# Patient Record
Sex: Female | Born: 1990 | Race: Asian | Hispanic: No | Marital: Married | Smoking: Never smoker
Health system: Southern US, Community
[De-identification: ages and names within clinical notes are randomized; demographics above are authoritative.]

---

## 2015-08-02 NOTE — L&D Delivery Note (Signed)
  Delivery Note At 6:24 AM a viable female was delivered via Vaginal, Spontaneous Delivery (Presentation: OA  ).  APGAR: 8, 8; weight 7 lb 1.8 oz (3225 g).   Placenta status: spont , intact .  Cord:  3 vessel  Anesthesia:   epidural Episiotomy: None Lacerations: None Est. Blood Loss (mL): 150  Mom to postpartum.  Baby to Couplet care / Skin to Skin.  Cam HaiSHAW, KIMBERLY 03/10/2016, 6:34 AM

## 2015-10-12 ENCOUNTER — Emergency Department (HOSPITAL_COMMUNITY)
Admission: EM | Admit: 2015-10-12 | Discharge: 2015-10-13 | Disposition: A | Discharge status: Home / Self Care | Payer: Medicaid Other | Attending: Emergency Medicine | Admitting: Emergency Medicine

## 2015-10-12 ENCOUNTER — Encounter (HOSPITAL_COMMUNITY): Payer: Self-pay | Admitting: Emergency Medicine

## 2015-10-12 ENCOUNTER — Emergency Department (HOSPITAL_COMMUNITY): Payer: Medicaid Other

## 2015-10-12 DIAGNOSIS — O99512 Diseases of the respiratory system complicating pregnancy, second trimester: Secondary | ICD-10-CM | POA: Diagnosis not present

## 2015-10-12 DIAGNOSIS — Z3A16 16 weeks gestation of pregnancy: Secondary | ICD-10-CM | POA: Diagnosis not present

## 2015-10-12 DIAGNOSIS — J069 Acute upper respiratory infection, unspecified: Secondary | ICD-10-CM | POA: Insufficient documentation

## 2015-10-12 LAB — POC URINE PREG, ED: PREG TEST UR: POSITIVE — AB

## 2015-10-12 NOTE — ED Notes (Signed)
Pt. reports runny nose , sore throat , productive cough and chest congestion onset last week , denies fever or chills. Pt. stated she is pregnant , LMP Nov 2016 .

## 2015-10-12 NOTE — ED Provider Notes (Signed)
CSN: [id number]     Arrival date & time 10/12/15  2218 History  By signing my name below, I, Shannon Davila, attest that this documentation has been prepared under the direction and in the presence of Dhanya Bogle, PA-C. Electronically Signed: Octavia Davila, ED Scribe. 10/12/2015. 11:09 PM.    Chief Complaint  Patient presents with  . Cough  . Sore Throat   The history is provided by the patient. No language interpreter was used.   HPI Comments: Shannon Davila is a 25 y.o. female who is 4 months pregnant presents to the Emergency Department complaining of cold-like symptoms onset one week ago. Pt has associated cough, sneezing, sore throat, rhinorrhea, headache, mild fever, and mild chills. She did not measure her temperature at home but endorses feeling warm. Her cough is productive of yellow mucus and she reports chest congestion. Denies chest pain or SOB. She endorses pain increased pain when swallowing. Denies difficulty swallowing or handling secretions. Pt did not receive her flu shot.She has been eating and drinking normally and states that she is well-hydrated. Denies dizziness, syncope, ear pain, eye pain, neck pain, abdominal pain, nausea, vomiting, vaginal discharge, dysuria or vaginal bleeding. Denies known sick contacts. She does not have prenatal care.   History reviewed. No pertinent past medical history. History reviewed. No pertinent past surgical history. No family history on file. Social History  Substance Use Topics  . Smoking status: Never Smoker   . Smokeless tobacco: None  . Alcohol Use: No   OB History    Gravida Para Term Preterm AB TAB SAB Ectopic Multiple Living   1              Review of Systems  Constitutional: Positive for fever and chills. Negative for appetite change.  HENT: Positive for congestion, rhinorrhea, sneezing and sore throat. Negative for ear pain.   Eyes: Negative for discharge and redness.  Respiratory: Positive for cough. Negative for  shortness of breath.   Cardiovascular: Negative for chest pain.  Gastrointestinal: Negative for nausea, vomiting and abdominal pain.  Musculoskeletal: Negative for myalgias, neck pain and neck stiffness.  Neurological: Positive for headaches. Negative for dizziness and syncope.  All other systems reviewed and are negative.     Allergies  Review of patient's allergies indicates no known allergies.  Home Medications   Prior to Admission medications   Not on File   Triage vitals: BP 109/66 mmHg  Pulse 86  Temp(Src) 98.2 F (36.8 C) (Oral)  Resp 14  Ht  (1.575 m)  Wt 108 lb (48.988 kg)  BMI 19.75 kg/m2  SpO2 100% Physical Exam  Constitutional: She appears well-developed and well-nourished. No distress.  Nontoxic appearing  HENT:  Head: Normocephalic and atraumatic.  Right Ear: Tympanic membrane and ear canal normal.  Left Ear: Tympanic membrane and ear canal normal.  Nose: Rhinorrhea present. Right sinus exhibits no maxillary sinus tenderness and no frontal sinus tenderness. Left sinus exhibits no maxillary sinus tenderness and no frontal sinus tenderness.  Mouth/Throat: Uvula is midline, oropharynx is clear and moist and mucous membranes are normal. No oropharyngeal exudate, posterior oropharyngeal edema or posterior oropharyngeal erythema.  Eyes: Conjunctivae are normal. Right eye exhibits no discharge. Left eye exhibits no discharge. No scleral icterus.  Neck: Normal range of motion. Neck supple.  Cardiovascular: Normal rate, regular rhythm and normal heart sounds.   Pulmonary/Chest: Effort normal and breath sounds normal. No respiratory distress.  Abdominal: Soft. There is no tenderness.  Musculoskeletal: Normal range of  motion.  Lymphadenopathy:    She has no cervical adenopathy.  Neurological: She is alert. Coordination normal.  Skin: Skin is warm and dry.  Psychiatric: She has a normal mood and affect. Her behavior is normal.  Nursing note and vitals  reviewed.   ED Course  Procedures  DIAGNOSTIC STUDIES: Oxygen Saturation is 100% on RA, normal by my interpretation.  COORDINATION OF CARE:  11:08 PM Discussed treatment plan which includes chest x-ray and swab throat with pt at bedside and pt agreed to plan.  Labs Review Labs Reviewed  POC URINE PREG, ED - Abnormal; Notable for the following:    Preg Test, Ur POSITIVE (*)    All other components within normal limits  RAPID STREP SCREEN (NOT [facility])  CULTURE, GROUP A STREP [facility])    Imaging Review Dg Chest 2 View  10/12/2015  CLINICAL DATA:  Productive cough. EXAM: CHEST  2 VIEW COMPARISON:  None. FINDINGS: Cardiomediastinal silhouette is normal in size and configuration. Lungs are clear. Lung volumes are normal. No evidence of pneumonia. No pleural effusion. No pneumothorax. Osseous and soft tissue structures about the chest are unremarkable. IMPRESSION: Lungs are clear and there is no evidence of acute cardiopulmonary abnormality. No evidence of pneumonia. Electronically Signed   By: Bary RichardStan  Maynard M.D.   On: 10/12/2015 23:46   I have personally reviewed and evaluated these images and lab results as part of my medical decision-making.   EKG Interpretation None      MDM   Final diagnoses:  URI (upper respiratory infection)   Patient presenting with rhinorrhea, sore throat, cough, HA, subjective fevers and chills x 1 week. Afebrile. Pt is nontoxic appearing. Rhinorrhea noted. No sinus tenderness. TMs pearly gray without erythema or effusion. Oropharynx without erythema, edema or exudate. Lungs CTAB. CXR negative for acute infiltrate. Rapid strep negative. Patients symptoms are consistent with URI, likely viral etiology. Discussed that antibiotics are not indicated for viral infections. Instructed to use Tylenol for fever and pain control. Advised not to use NSAIDs. Patient is to call women's clinic in the morning to schedule a prenatal appointment and get a list of pregnancy safe  over-the-counter cold and flu medications. Discussed importance of prenatal care and pt verbalizes understanding and is agreeable with plan. Pt is hemodynamically stable & in NAD prior to dc.  I personally performed the services described in this documentation, which was scribed in my presence. The recorded information has been reviewed and is accurate.    Alveta HeimlichStevi Albert Devaul, PA-C 10/13/15 0047  Alveta HeimlichStevi Leela Vanbrocklin, PA-C 10/13/15 0050  Margarita Grizzleanielle Ray, MD 10/15/15 1240

## 2015-10-12 NOTE — ED Notes (Signed)
Pt reports she is 4 months pregnant, due in August.

## 2015-10-13 ENCOUNTER — Encounter: Payer: Self-pay | Admitting: *Deleted

## 2015-10-13 LAB — RAPID STREP SCREEN (MED CTR MEBANE ONLY): STREPTOCOCCUS, GROUP A SCREEN (DIRECT): NEGATIVE

## 2015-10-13 NOTE — Discharge Instructions (Signed)
Use tylenol for pain and fever control. Call the women's clinic tomorrow morning to schedule a prenatal appointment. They can help you pick safe over-the-counter cold and flu medications for pregnancy.. Do not take motrin or ibuprofen. Return to the emergency department with any new, worsening or concerning symptoms.   Upper Respiratory Infection, Adult Most upper respiratory infections (URIs) are a viral infection of the air passages leading to the lungs. A URI affects the nose, throat, and upper air passages. The most common type of URI is nasopharyngitis and is typically referred to as "the common cold." URIs run their course and usually go away on their own. Most of the time, a URI does not require medical attention, but sometimes a bacterial infection in the upper airways can follow a viral infection. This is called a secondary infection. Sinus and middle ear infections are common types of secondary upper respiratory infections. Bacterial pneumonia can also complicate a URI. A URI can worsen asthma and chronic obstructive pulmonary disease (COPD). Sometimes, these complications can require emergency medical care and may be life threatening.  CAUSES Almost all URIs are caused by viruses. A virus is a type of germ and can spread from one person to another.  RISKS FACTORS You may be at risk for a URI if:   You smoke.   You have chronic heart or lung disease.  You have a weakened defense (immune) system.   You are very young or very old.   You have nasal allergies or asthma.  You work in crowded or poorly ventilated areas.  You work in health care facilities or schools. SIGNS AND SYMPTOMS  Symptoms typically develop 2-3 days after you come in contact with a cold virus. Most viral URIs last 7-10 days. However, viral URIs from the influenza virus (flu virus) can last 14-18 days and are typically more severe. Symptoms may include:   Runny or stuffy (congested) nose.   Sneezing.    Cough.   Sore throat.   Headache.   Fatigue.   Fever.   Loss of appetite.   Pain in your forehead, behind your eyes, and over your cheekbones (sinus pain).  Muscle aches.  DIAGNOSIS  Your health care provider may diagnose a URI by:  Physical exam.  Tests to check that your symptoms are not due to another condition such as:  Strep throat.  Sinusitis.  Pneumonia.  Asthma. TREATMENT  A URI goes away on its own with time. It cannot be cured with medicines, but medicines may be prescribed or recommended to relieve symptoms. Medicines may help:  Reduce your fever.  Reduce your cough.  Relieve nasal congestion. HOME CARE INSTRUCTIONS   Take medicines only as directed by your health care provider.   Gargle warm saltwater or take cough drops to comfort your throat as directed by your health care provider.  Use a warm mist humidifier or inhale steam from a shower to increase air moisture. This may make it easier to breathe.  Drink enough fluid to keep your urine clear or pale yellow.   Eat soups and other clear broths and maintain good nutrition.   Rest as needed.   Return to work when your temperature has returned to normal or as your health care provider advises. You may need to stay home longer to avoid infecting others. You can also use a face mask and careful hand washing to prevent spread of the virus.  Increase the usage of your inhaler if you have asthma.  Do not use any tobacco products, including cigarettes, chewing tobacco, or electronic cigarettes. If you need help quitting, ask your health care provider. PREVENTION  The best way to protect yourself from getting a cold is to practice good hygiene.   Avoid oral or hand contact with people with cold symptoms.   Wash your hands often if contact occurs.  There is no clear evidence that vitamin C, vitamin E, echinacea, or exercise reduces the chance of developing a cold. However, it is  always recommended to get plenty of rest, exercise, and practice good nutrition.  SEEK MEDICAL CARE IF:   You are getting worse rather than better.   Your symptoms are not controlled by medicine.   You have chills.  You have worsening shortness of breath.  You have brown or red mucus.  You have yellow or brown nasal discharge.  You have pain in your face, especially when you bend forward.  You have a fever.  You have swollen neck glands.  You have pain while swallowing.  You have white areas in the back of your throat. SEEK IMMEDIATE MEDICAL CARE IF:   You have severe or persistent:  Headache.  Ear pain.  Sinus pain.  Chest pain.  You have chronic lung disease and any of the following:  Wheezing.  Prolonged cough.  Coughing up blood.  A change in your usual mucus.  You have a stiff neck.  You have changes in your:  Vision.  Hearing.  Thinking.  Mood. MAKE SURE YOU:   Understand these instructions.  Will watch your condition.  Will get help right away if you are not doing well or get worse.   This information is not intended to replace advice given to you by your health care provider. Make sure you discuss any questions you have with your health care provider.   Document Released: 01/11/2001 Document Revised: 12/02/2014 Document Reviewed: 10/23/2013 Elsevier Interactive Patient Education [employer].

## 2015-10-15 LAB — CULTURE, GROUP A STREP (THRC)

## 2015-11-04 ENCOUNTER — Encounter: Payer: Self-pay | Admitting: Certified Nurse Midwife

## 2015-11-04 ENCOUNTER — Other Ambulatory Visit (HOSPITAL_COMMUNITY)
Admission: RE | Admit: 2015-11-04 | Discharge: 2015-11-04 | Disposition: A | Discharge status: Home / Self Care | Payer: Medicaid Other | Source: Ambulatory Visit | Attending: Certified Nurse Midwife | Admitting: Certified Nurse Midwife

## 2015-11-04 ENCOUNTER — Ambulatory Visit (INDEPENDENT_AMBULATORY_CARE_PROVIDER_SITE_OTHER): Payer: Medicaid Other | Admitting: Advanced Practice Midwife

## 2015-11-04 VITALS — BP 91/50 | HR 66 | Temp 98.0°F | Wt 110.7 lb

## 2015-11-04 DIAGNOSIS — Z01419 Encounter for gynecological examination (general) (routine) without abnormal findings: Secondary | ICD-10-CM | POA: Insufficient documentation

## 2015-11-04 DIAGNOSIS — Z1389 Encounter for screening for other disorder: Secondary | ICD-10-CM

## 2015-11-04 DIAGNOSIS — Z113 Encounter for screening for infections with a predominantly sexual mode of transmission: Secondary | ICD-10-CM | POA: Diagnosis present

## 2015-11-04 DIAGNOSIS — Z3402 Encounter for supervision of normal first pregnancy, second trimester: Secondary | ICD-10-CM

## 2015-11-04 DIAGNOSIS — Z603 Acculturation difficulty: Secondary | ICD-10-CM | POA: Insufficient documentation

## 2015-11-04 DIAGNOSIS — O0932 Supervision of pregnancy with insufficient antenatal care, second trimester: Secondary | ICD-10-CM | POA: Insufficient documentation

## 2015-11-04 LAB — POCT URINALYSIS DIP (DEVICE)
Bilirubin Urine: NEGATIVE
Glucose, UA: NEGATIVE mg/dL
Hgb urine dipstick: NEGATIVE
Ketones, ur: NEGATIVE mg/dL
Nitrite: NEGATIVE
Protein, ur: 30 mg/dL — AB
Specific Gravity, Urine: 1.025 (ref 1.005–1.030)
Urobilinogen, UA: 0.2 mg/dL (ref 0.0–1.0)
pH: 7 (ref 5.0–8.0)

## 2015-11-04 NOTE — Progress Notes (Signed)
New OB. Routines reviewed.  See SmartSet  Subjective:    Shannon Davila is a G1P0 1368w3d being seen today for her first obstetrical visit.  Her obstetrical history is significant for Late to care, language barrier. Patient does intend to breast feed. Pregnancy history fully reviewed.  Patient reports no complaints.  Filed Vitals:   11/04/15 0815  BP: 91/50  Pulse: 66  Temp: 98 F (36.7 C)  Weight: 110 lb 11.2 oz (50.213 kg)    HISTORY: OB History  Gravida Para Term Preterm AB SAB TAB Ectopic Multiple Living  1             # Outcome Date GA Lbr Len/2nd Weight Sex Delivery Anes PTL Lv  1 Current              History reviewed. No pertinent past medical history. History reviewed. No pertinent past surgical history. History reviewed. No pertinent family history.   Exam    Uterus:  Fundal Height: 21 cm  Pelvic Exam:    Perineum: No Hemorrhoids, Normal Perineum   Vulva: Bartholin's, Urethra, Skene's normal   Vagina:  normal mucosa, normal discharge   pH:    Cervix: nulliparous appearance and Very friable cervix   Adnexa: no mass, fullness, tenderness   Bony Pelvis: gynecoid  System: Breast:  normal appearance, no masses or tenderness   Skin: normal coloration and turgor, no rashes    Neurologic: oriented, grossly non-focal   Extremities: normal strength, tone, and muscle mass   HEENT neck supple with midline trachea   Mouth/Teeth mucous membranes moist, pharynx normal without lesions   Neck supple and no masses   Cardiovascular: regular rate and rhythm, no murmurs or gallops   Respiratory:  appears well, vitals normal, no respiratory distress, acyanotic, normal RR, ear and throat exam is normal, neck free of mass or lymphadenopathy, chest clear, no wheezing, crepitations, rhonchi, normal symmetric air entry   Abdomen: soft, non-tender; bowel sounds normal; no masses,  no organomegaly   Urinary: urethral meatus normal      Assessment:    Pregnancy: G1P0 Patient  Active Problem List   Diagnosis Date Noted  . Language barrier, cultural differences 11/04/2015  . Supervision of normal first pregnancy in second trimester 11/04/2015  . Late prenatal care affecting pregnancy in second trimester, antepartum 11/04/2015        Plan:     Initial labs drawn. Prenatal vitamins. Problem list reviewed and updated. Genetic Screening discussed Quad Screen: ordered.  Ultrasound discussed; fetal survey: ordered.  Follow up in 4 weeks. 50% of 30 min visit spent on counseling and coordination of care.   Patient refused video interpretor. States husband translates for her. As we talked, husband spoke [place]. Patient understood most of my English and answered appropriately.  However, he states they do not speak each others' language (she speaks [place]ian, he speaks [place]).  He translates to her [place] to [place]. Though we spoke the same words, she listened to him.   I feel that from now on, we should insist on using translator, esp for important information.      [facility] HospitalWILLIAMS,Shannon Qazi 11/04/2015

## 2015-11-04 NOTE — Patient Instructions (Signed)

## 2015-11-05 LAB — PRENATAL PROFILE (SOLSTAS)
Antibody Screen: NEGATIVE
Basophils Absolute: 0 cells/uL (ref 0–200)
Basophils Relative: 0 %
Eosinophils Absolute: 103 cells/uL (ref 15–500)
Eosinophils Relative: 1 %
HCT: 31.4 % — ABNORMAL LOW (ref 35.0–45.0)
HIV 1&2 Ab, 4th Generation: NONREACTIVE
Hemoglobin: 10.5 g/dL — ABNORMAL LOW (ref 11.7–15.5)
Hepatitis B Surface Ag: NEGATIVE
Lymphocytes Relative: 27 %
Lymphs Abs: 2781 cells/uL (ref 850–3900)
MCH: 30.4 pg (ref 27.0–33.0)
MCHC: 33.4 g/dL (ref 32.0–36.0)
MCV: 91 fL (ref 80.0–100.0)
MPV: 10.2 fL (ref 7.5–12.5)
Monocytes Absolute: 618 cells/uL (ref 200–950)
Monocytes Relative: 6 %
Neutro Abs: 6798 cells/uL (ref 1500–7800)
Neutrophils Relative %: 66 %
Platelets: 327 10*3/uL (ref 140–400)
RBC: 3.45 MIL/uL — ABNORMAL LOW (ref 3.80–5.10)
RDW: 14.4 % (ref 11.0–15.0)
Rh Type: POSITIVE
Rubella: 11.5 Index — ABNORMAL HIGH (ref <0.90)
WBC: 10.3 10*3/uL (ref 3.8–10.8)

## 2015-11-05 LAB — CYTOLOGY - PAP

## 2015-11-05 LAB — GC/CHLAMYDIA PROBE AMP (~~LOC~~) NOT AT ARMC
CHLAMYDIA, DNA PROBE: NEGATIVE
NEISSERIA GONORRHEA: NEGATIVE

## 2015-11-05 LAB — CULTURE, OB URINE
Colony Count: NO GROWTH
Organism ID, Bacteria: NO GROWTH

## 2015-11-06 LAB — PRESCRIPTION MONITORING PROFILE (19 PANEL)
Amphetamine/Meth: NEGATIVE ng/mL
Barbiturate Screen, Urine: NEGATIVE ng/mL
Benzodiazepine Screen, Urine: NEGATIVE ng/mL
Buprenorphine, Urine: NEGATIVE ng/mL
Cannabinoid Scrn, Ur: NEGATIVE ng/mL
Carisoprodol, Urine: NEGATIVE ng/mL
Cocaine Metabolites: NEGATIVE ng/mL
Creatinine, Urine: 193.61 mg/dL (ref >20.0)
Fentanyl, Ur: NEGATIVE ng/mL
MDMA URINE: NEGATIVE ng/mL
Meperidine, Ur: NEGATIVE ng/mL
Methadone Screen, Urine: NEGATIVE ng/mL
Methaqualone: NEGATIVE ng/mL
Nitrites, Initial: NEGATIVE ug/mL
Opiate Screen, Urine: NEGATIVE ng/mL
Oxycodone Screen, Ur: NEGATIVE ng/mL
Phencyclidine, Ur: NEGATIVE ng/mL
Propoxyphene: NEGATIVE ng/mL
Tapentadol, urine: NEGATIVE ng/mL
Tramadol Scrn, Ur: NEGATIVE ng/mL
Zolpidem, Urine: NEGATIVE ng/mL
pH, Initial: 6.8 pH (ref 4.5–8.9)

## 2015-11-09 LAB — AFP, QUAD SCREEN
AFP: 68.6 ng/mL
Age Alone: 1:1040 {titer}
Curr Gest Age: 21.4 weeks
Down Syndrome Scr Risk Est: 1:13900 {titer}
HCG, Total: 11.63 IU/mL
INH: 214.4 pg/mL
Interpretation-AFP: NEGATIVE
MoM for AFP: 0.67
MoM for INH: 0.94
MoM for hCG: 0.39
Open Spina bifida: NEGATIVE
Osb Risk: <1:27300 {titer}
Tri 18 Scr Risk Est: NEGATIVE
Trisomy 18 (Edward) Syndrome Interp.: 1:3320 {titer}
uE3 Mom: 0.82
uE3 Value: 2.65 ng/mL

## 2015-11-12 ENCOUNTER — Ambulatory Visit (HOSPITAL_COMMUNITY)
Admission: RE | Admit: 2015-11-12 | Discharge: 2015-11-12 | Disposition: A | Discharge status: Home / Self Care | Payer: Medicaid Other | Source: Ambulatory Visit | Attending: Certified Nurse Midwife | Admitting: Certified Nurse Midwife

## 2015-11-12 ENCOUNTER — Other Ambulatory Visit: Payer: Self-pay | Admitting: Certified Nurse Midwife

## 2015-11-12 DIAGNOSIS — Z1389 Encounter for screening for other disorder: Secondary | ICD-10-CM

## 2015-11-12 DIAGNOSIS — Z36 Encounter for antenatal screening of mother: Secondary | ICD-10-CM | POA: Insufficient documentation

## 2015-11-12 DIAGNOSIS — O0932 Supervision of pregnancy with insufficient antenatal care, second trimester: Secondary | ICD-10-CM | POA: Insufficient documentation

## 2015-11-12 DIAGNOSIS — Z3A22 22 weeks gestation of pregnancy: Secondary | ICD-10-CM | POA: Diagnosis not present

## 2015-12-29 ENCOUNTER — Other Ambulatory Visit: Payer: Self-pay | Admitting: Advanced Practice Midwife

## 2015-12-29 DIAGNOSIS — Z363 Encounter for antenatal screening for malformations: Secondary | ICD-10-CM

## 2015-12-29 DIAGNOSIS — Z1389 Encounter for screening for other disorder: Secondary | ICD-10-CM

## 2015-12-29 MED ORDER — PRENATAL 27-0.8 MG PO TABS: 1.0000 | ORAL_TABLET | Freq: Every day | ORAL | Status: AC

## 2015-12-29 NOTE — Progress Notes (Signed)
[facility].  Initial OB appt made.  Pt husband asked if another US appointment is needed. Reviewed pt anatomy Koreas on 4/13.  US normal, no recommendation for follow up.  Notified pt and husband that she should start care as scheduled here in the office.  Pt husband translating for pt at window and he states understanding and will return for OB appt.  [facility] [address] [facility]

## 2015-12-30 ENCOUNTER — Telehealth: Payer: Self-pay | Admitting: General Practice

## 2015-12-30 NOTE — Telephone Encounter (Signed)
Opened in error

## 2016-01-08 ENCOUNTER — Ambulatory Visit (INDEPENDENT_AMBULATORY_CARE_PROVIDER_SITE_OTHER): Payer: Medicaid Other | Admitting: Family Medicine

## 2016-01-08 VITALS — BP 112/59 | HR 69 | Wt 119.0 lb

## 2016-01-08 DIAGNOSIS — Z23 Encounter for immunization: Secondary | ICD-10-CM | POA: Diagnosis not present

## 2016-01-08 DIAGNOSIS — Z3403 Encounter for supervision of normal first pregnancy, third trimester: Secondary | ICD-10-CM

## 2016-01-08 DIAGNOSIS — Z3402 Encounter for supervision of normal first pregnancy, second trimester: Secondary | ICD-10-CM

## 2016-01-08 DIAGNOSIS — O365930 Maternal care for other known or suspected poor fetal growth, third trimester, not applicable or unspecified: Secondary | ICD-10-CM

## 2016-01-08 LAB — CBC
HCT: 36.7 % (ref 35.0–45.0)
HEMOGLOBIN: 12 g/dL (ref 11.7–15.5)
MCH: 30.4 pg (ref 27.0–33.0)
MCHC: 32.7 g/dL (ref 32.0–36.0)
MCV: 92.9 fL (ref 80.0–100.0)
MPV: 10.1 fL (ref 7.5–12.5)
PLATELETS: 348 10*3/uL (ref 140–400)
RBC: 3.95 MIL/uL (ref 3.80–5.10)
RDW: 13.3 % (ref 11.0–15.0)
WBC: 9 10*3/uL (ref 3.8–10.8)

## 2016-01-08 MED ORDER — TETANUS-DIPHTH-ACELL PERTUSSIS 5-2.5-18.5 LF-MCG/0.5 IM SUSP
0.5000 mL | Freq: Once | INTRAMUSCULAR | Status: AC
  Administered 2016-01-08: 0.5 mL via INTRAMUSCULAR

## 2016-01-08 NOTE — Progress Notes (Signed)
Subjective:  Shannon Davila is a 25 y.o. G1P0 at 8727w5d being seen today for ongoing prenatal care.  She is currently monitored for the following issues for this low-risk pregnancy and has Language barrier, cultural differences; Supervision of normal first pregnancy in second trimester; and Late prenatal care affecting pregnancy in second trimester, antepartum on her problem list.  Patient reports no complaints.  Contractions: Not present. Vag. Bleeding: None.  Movement: Present. Denies leaking of fluid.   The following portions of the patient's history were reviewed and updated as appropriate: allergies, current medications, past family history, past medical history, past social history, past surgical history and problem list. Problem list updated.  Objective:   Filed Vitals:   01/08/16 1002  BP: 112/59  Pulse: 69  Weight: 119 lb (53.978 kg)    Fetal Status: Fetal Heart Rate (bpm): 150 Fundal Height: 25 cm Movement: Present     General:  Alert, oriented and cooperative. Patient is in no acute distress.  Skin: Skin is warm and dry. No rash noted.   Cardiovascular: Normal heart rate noted  Respiratory: Normal respiratory effort, no problems with respiration noted  Abdomen: Soft, gravid, appropriate for gestational age. Pain/Pressure: Absent     Pelvic: Cervical exam deferred        Extremities: Normal range of motion.  Edema: None  Mental Status: Normal mood and affect. Normal behavior. Normal judgment and thought content.   Urinalysis:      Assessment and Plan:  Pregnancy: G1P0 at 1727w5d  1. Supervision of normal first pregnancy in second trimester 28 week labs.  FH measuring behind.  Will get US. - Glucose Tolerance, 1 HR (50g) w/o Fasting - CBC - RPR - HIV antibody (with reflex) - US MFM OB FOLLOW UP; Future - Tdap (BOOSTRIX) injection 0.5 mL; Inject 0.5 mLs into the muscle once.  2. Small for dates affecting management of mother, third trimester, not applicable or unspecified  fetus  - US MFM OB FOLLOW UP; Future - Tdap (BOOSTRIX) injection 0.5 mL; Inject 0.5 mLs into the muscle once.  3. Need for Tdap vaccination  - Tdap (BOOSTRIX) injection 0.5 mL; Inject 0.5 mLs into the muscle once.  Preterm labor symptoms and general obstetric precautions including but not limited to vaginal bleeding, contractions, leaking of fluid and fetal movement were reviewed in detail with the patient. Please refer to After Visit Summary for other counseling recommendations.  No Follow-up on file.   Levie HeritageJacob J Stinson, DO

## 2016-01-08 NOTE — Progress Notes (Signed)
28 week labs today. Offered tdap to patient.

## 2016-01-09 LAB — HIV ANTIBODY (ROUTINE TESTING W REFLEX): HIV 1&2 Ab, 4th Generation: NONREACTIVE

## 2016-01-09 LAB — RPR

## 2016-01-09 LAB — GLUCOSE TOLERANCE, 1 HOUR (50G) W/O FASTING: GLUCOSE, 1 HR, GESTATIONAL: 70 mg/dL (ref <140)

## 2016-01-11 LAB — POCT URINALYSIS DIP (DEVICE)
Bilirubin Urine: NEGATIVE
Glucose, UA: NEGATIVE mg/dL
HGB URINE DIPSTICK: NEGATIVE
Ketones, ur: NEGATIVE mg/dL
Leukocytes, UA: NEGATIVE
Nitrite: POSITIVE — AB
PH: 7.5 (ref 5.0–8.0)
PROTEIN: 30 mg/dL — AB
SPECIFIC GRAVITY, URINE: 1.015 (ref 1.005–1.030)
UROBILINOGEN UA: 0.2 mg/dL (ref 0.0–1.0)

## 2016-01-18 ENCOUNTER — Ambulatory Visit (HOSPITAL_COMMUNITY)
Admission: RE | Admit: 2016-01-18 | Discharge: 2016-01-18 | Disposition: A | Discharge status: Home / Self Care | Payer: Medicaid Other | Source: Ambulatory Visit | Attending: Family Medicine | Admitting: Family Medicine

## 2016-01-18 DIAGNOSIS — O365930 Maternal care for other known or suspected poor fetal growth, third trimester, not applicable or unspecified: Secondary | ICD-10-CM

## 2016-01-18 DIAGNOSIS — Z3A32 32 weeks gestation of pregnancy: Secondary | ICD-10-CM | POA: Diagnosis not present

## 2016-01-18 DIAGNOSIS — O26843 Uterine size-date discrepancy, third trimester: Secondary | ICD-10-CM | POA: Diagnosis not present

## 2016-01-18 DIAGNOSIS — Z3402 Encounter for supervision of normal first pregnancy, second trimester: Secondary | ICD-10-CM

## 2016-01-20 ENCOUNTER — Ambulatory Visit (INDEPENDENT_AMBULATORY_CARE_PROVIDER_SITE_OTHER): Payer: Medicaid Other | Admitting: Obstetrics and Gynecology

## 2016-01-20 VITALS — BP 106/64 | HR 67 | Wt 120.6 lb

## 2016-01-20 DIAGNOSIS — O0933 Supervision of pregnancy with insufficient antenatal care, third trimester: Secondary | ICD-10-CM | POA: Diagnosis not present

## 2016-01-20 DIAGNOSIS — O26843 Uterine size-date discrepancy, third trimester: Secondary | ICD-10-CM

## 2016-01-20 LAB — POCT URINALYSIS DIP (DEVICE)
BILIRUBIN URINE: NEGATIVE
Glucose, UA: NEGATIVE mg/dL
HGB URINE DIPSTICK: NEGATIVE
Ketones, ur: NEGATIVE mg/dL
NITRITE: NEGATIVE
Protein, ur: NEGATIVE mg/dL
Specific Gravity, Urine: 1.02 (ref 1.005–1.030)
UROBILINOGEN UA: 0.2 mg/dL (ref 0.0–1.0)
pH: 7 (ref 5.0–8.0)

## 2016-01-20 NOTE — Progress Notes (Signed)
Used Electronics engineerKhmer Interpreter number: [phone]

## 2016-01-20 NOTE — Progress Notes (Signed)
Subjective:  Shannon Davila is a 25 y.o. G1P0 at 1574w3d being seen today for ongoing prenatal care.  She is currently monitored for the following issues for this high-risk pregnancy and has Language barrier, cultural differences; Supervision of normal first pregnancy in second trimester; Late prenatal care affecting pregnancy in second trimester, antepartum; and Fundal height low for dates in third trimester on her problem list.  Patient reports no complaints.   Contractions: Not present. Vag. Bleeding: None.  Movement: Present. Denies leaking of fluid.   The following portions of the patient's history were reviewed and updated as appropriate: allergies, current medications, past family history, past medical history, past social history, past surgical history and problem list. Problem list updated.  Objective:   Filed Vitals:   01/20/16 1411  BP: 106/64  Pulse: 67  Weight: 120 lb 9.6 oz (54.704 kg)    Fetal Status: Fetal Heart Rate (bpm): 145   Movement: Present     General:  Alert, oriented and cooperative. Patient is in no acute distress.  Skin: Skin is warm and dry. No rash noted.   Cardiovascular: Normal heart rate noted  Respiratory: Normal respiratory effort, no problems with respiration noted  Abdomen: Soft, gravid, appropriate for gestational age. Pain/Pressure: Present     Pelvic:  Cervical exam deferred        Extremities: Normal range of motion.  Edema: None  Mental Status: Normal mood and affect. Normal behavior. Normal judgment and thought content.   Urinalysis:      Assessment and Plan:  Pregnancy: G1P0 at 8074w3d  1. Fundal height low for dates in third trimester Routine PNC D/w her that if still s<d nv that would get another growth u/s 4wks s/p 6/19 u/s [facility] precautions given  Preterm labor symptoms and general obstetric precautions including but not limited to vaginal bleeding, contractions, leaking of fluid and fetal movement were reviewed in detail with the  patient. Please refer to After Visit Summary for other counseling recommendations.   Video interpreter used  Return in about 2 weeks (around 02/03/2016).   Airmont Bingharlie Livia Tarr, MD

## 2016-02-10 ENCOUNTER — Encounter: Payer: Medicaid Other | Admitting: Family Medicine

## 2016-02-29 ENCOUNTER — Encounter: Payer: Self-pay | Admitting: Obstetrics and Gynecology

## 2016-03-01 ENCOUNTER — Inpatient Hospital Stay (HOSPITAL_COMMUNITY)
Admission: AD | Admit: 2016-03-01 | Discharge: 2016-03-01 | Disposition: A | Discharge status: Home / Self Care | Payer: Medicaid Other | Source: Ambulatory Visit | Attending: Obstetrics & Gynecology | Admitting: Obstetrics & Gynecology

## 2016-03-01 ENCOUNTER — Ambulatory Visit (INDEPENDENT_AMBULATORY_CARE_PROVIDER_SITE_OTHER): Payer: Medicaid Other | Admitting: Family

## 2016-03-01 ENCOUNTER — Encounter (HOSPITAL_COMMUNITY): Payer: Self-pay | Admitting: *Deleted

## 2016-03-01 ENCOUNTER — Encounter: Payer: Self-pay | Admitting: Obstetrics & Gynecology

## 2016-03-01 VITALS — BP 130/77 | HR 79 | Wt 124.6 lb

## 2016-03-01 DIAGNOSIS — Z3402 Encounter for supervision of normal first pregnancy, second trimester: Secondary | ICD-10-CM

## 2016-03-01 DIAGNOSIS — O0932 Supervision of pregnancy with insufficient antenatal care, second trimester: Secondary | ICD-10-CM

## 2016-03-01 DIAGNOSIS — Z3A38 38 weeks gestation of pregnancy: Secondary | ICD-10-CM | POA: Insufficient documentation

## 2016-03-01 DIAGNOSIS — Z3493 Encounter for supervision of normal pregnancy, unspecified, third trimester: Secondary | ICD-10-CM | POA: Diagnosis not present

## 2016-03-01 LAB — POCT URINALYSIS DIP (DEVICE)
Bilirubin Urine: NEGATIVE
Glucose, UA: NEGATIVE mg/dL
HGB URINE DIPSTICK: NEGATIVE
KETONES UR: NEGATIVE mg/dL
Nitrite: NEGATIVE
PH: 7 (ref 5.0–8.0)
PROTEIN: NEGATIVE mg/dL
Specific Gravity, Urine: 1.02 (ref 1.005–1.030)
Urobilinogen, UA: 0.2 mg/dL (ref 0.0–1.0)

## 2016-03-01 LAB — AMNISURE RUPTURE OF MEMBRANE (ROM) NOT AT ARMC: AMNISURE: NEGATIVE

## 2016-03-01 NOTE — MAU Provider Note (Signed)
MAU HISTORY AND PHYSICAL  Chief Complaint:  Rule out rupture  Shannon Davila is a 25 y.o.  G1P0  at [redacted]w[redacted]d presenting for rule out rupture from [facility] clinic. Patient states she has been having  none contractions, none vaginal bleeding, intact, neg pooling in clinic, neg fern in clinic membranes, with active fetal movement.    No past medical history on file.  No past surgical history on file.  No family history on file.  Social History  Substance Use Topics  . Smoking status: Never Smoker  . Smokeless tobacco: Never Used  . Alcohol use No    No Known Allergies  Prescriptions Prior to Admission  Medication Sig Dispense Refill Last Dose  . Prenatal Vit-Fe Fumarate-FA (MULTIVITAMIN-PRENATAL) 27-0.8 MG TABS tablet Take 1 tablet by mouth daily at 12 noon. 30 each 10 Taking    Review of Systems - Negative except for what is mentioned in HPI.  Physical Exam  Last menstrual period 06/07/2015. GENERAL: Well-developed, well-nourished female in no acute distress.  LUNGS: Clear to auscultation bilaterally.  HEART: Regular rate and rhythm. ABDOMEN: Soft, nontender, nondistended, gravid.  EXTREMITIES: Nontender, no edema, 2+ distal pulses. FHT:  Doppler normal   Labs: Results for orders placed or performed in visit on 03/01/16 (from the past 24 hour(s))  POCT urinalysis dip (device)   Collection Time: 03/01/16  8:56 AM  Result Value Ref Range   Glucose, UA NEGATIVE NEGATIVE mg/dL   Bilirubin Urine NEGATIVE NEGATIVE   Ketones, ur NEGATIVE NEGATIVE mg/dL   Specific Gravity, Urine 1.020 1.005 - 1.030   Hgb urine dipstick NEGATIVE NEGATIVE   pH 7.0 5.0 - 8.0   Protein, ur NEGATIVE NEGATIVE mg/dL   Urobilinogen, UA 0.2 0.0 - 1.0 mg/dL   Nitrite NEGATIVE NEGATIVE   Leukocytes, UA TRACE (A) NEGATIVE    Imaging Studies:  No results found.  Assessment: Shannon Davila is  25 y.o. G1P0 at [redacted]w[redacted]d presents with concern for rupture.  Plan: Rule out rupture: amnisure negative, pt discharged  home  Loni Muse 8/1/201710:45 AM   OB FELLOW MAU DISCHARGE ATTESTATION  I have seen and examined this patient; I agree with above documentation in the resident's note.    Jen Mow, DO  OB Fellow 2:26 PM

## 2016-03-01 NOTE — Progress Notes (Signed)
Dr. Chanetta Marshall notified of negative amnisure results.  Provider states to discharge pt.

## 2016-03-01 NOTE — MAU Note (Signed)
Pt sent to MAU from clinic for amnisure.  Pt wants to leave, states she has another appointment.  Dr. Chanetta Marshall in to see pt & her husband.

## 2016-03-01 NOTE — Progress Notes (Signed)
C/o leaking small amts of watery fluid

## 2016-03-01 NOTE — Progress Notes (Signed)
Subjective:  Shannon Davila is a 25 y.o. G1P0 at [redacted]w[redacted]d being seen today for ongoing prenatal care.  She is currently monitored for the following issues for this low-risk pregnancy and has Language barrier, cultural differences; Supervision of normal first pregnancy in second trimester; Late prenatal care affecting pregnancy in second trimester, antepartum; and Fundal height low for dates in third trimester on her problem list.  Patient reports leaking of fluid last week.  Underwear is often wet..  Contractions: Not present. Vag. Bleeding: None.  Movement: Present. Denies leaking of fluid.   The following portions of the patient's history were reviewed and updated as appropriate: allergies, current medications, past family history, past medical history, past social history, past surgical history and problem list. Problem list updated.  Objective:   Vitals:   03/01/16 0937  BP: 130/77  Pulse: 79  Weight: 124 lb 9.6 oz (56.5 kg)    Fetal Status: Fetal Heart Rate (bpm): 132C/o leaking small amts watery fluid   Movement: Present     General:  Alert, oriented and cooperative. Patient is in no acute distress.  Skin: Skin is warm and dry. No rash noted.   Cardiovascular: Normal heart rate noted  Respiratory: Normal respiratory effort, no problems with respiration noted  Abdomen: Soft, gravid, appropriate for gestational age. Pain/Pressure: Present     Pelvic:  Cervical exam performed      2/thick; +pooling, neg fern  Extremities: Normal range of motion.  Edema: None  Mental Status: Normal mood and affect. Normal behavior. Normal judgment and thought content.   Urinalysis: Urine Protein: Negative Urine Glucose: Negative  Assessment and Plan:  Pregnancy: G1P0 at [redacted]w[redacted]d  1. Supervision of normal first pregnancy in second trimester - Reviewed signs of labor  2. Late prenatal care affecting pregnancy in second trimester, antepartum - Continue monitoring  3.  Vaginal Discharge - To MAU for  amnisure  Term labor symptoms and general obstetric precautions including but not limited to vaginal bleeding, contractions, leaking of fluid and fetal movement were reviewed in detail with the patient. Please refer to After Visit Summary for other counseling recommendations.  Return in about 1 week (around 03/08/2016).   Eino Farber Kennith Gain, CNM   All information obtained and conveyed via video interpreter

## 2016-03-01 NOTE — MAU Note (Signed)
Pt states she had some watery discharge last week.  Pt was sent by the clinic to have an amnisure.

## 2016-03-01 NOTE — Patient Instructions (Signed)
AREA PEDIATRIC/FAMILY PRACTICE PHYSICIANS  [facility] [address] [place], [place] [address] Phone - [phone]   Fax - [phone]  [facility] [address] [place], [place]  [address] Phone - [phone]   Fax - [phone]  [facility] [address] [place], [place]  [address] Phone - [phone]   Fax - [phone]  [facility] [address] [place], [place]  [address] Phone - [phone]   Fax - [phone]  [facility] [address], [address] [place], [place]  [address] Phone - [phone]   Fax - [phone]  [facility] [address], [address] [place], [place]  [address] Phone - [phone]   Fax - [phone]  [facility] [address] [place], [place]  [address] Phone - [phone]   Fax - [phone]  [facility] [address], [address] [place], [place]  [address] Phone - [phone]   Fax - [phone]  [facility] [address] [place], [place]  [address] Phone - [phone]   Fax - [phone] [facility] [address] [place], [place]  [address] Phone - [phone]   Fax - [phone]  [facility] [address] [place], [place]  [address] Phone - [phone]   Fax - [phone]  [facility] [address] [place], [place]  [address] Phone - [phone]   Fax - [phone]  [facility] [address] [place], [place]  [address] Phone - [phone]   Fax - [phone]  [facility] GOSR[facility] [address] [place], [place]  [address] Phone - [phone]  [facility] [address] [place], [place]  [address] Phone - [phone]   Fax - [phone]  [facility] [address] [place], [place]  [address] Phone - [phone]   Fax - [phone]  [facility] [address] [place], [place]  [address] Phone - [phone]   Fax - [phone]  [facility] [address] [place], [place]  [address] Phone - [phone]   Fax - [phone]   [facility] [address] [place], [place]  [address] Phone - [phone]   Fax - [phone]  [facility] [address] [place], [place]  [address] Phone - [phone]   Fax - [phone]  [facility] [address] [place], [place]  [address] Phone - [phone]   Fax - [phone]  [facility] [address] [place], [place]  [address] Phone - [phone]   Fax - [phone]  [facility] [address] [place], [place]  [address] Phone - [phone]   Fax - [phone] [facility] [address] [place], [place]  [address] Phone - [phone]   Fax - [phone]  [facility] [address] [place], [place]  [address] Phone - [phone]   Fax - [phone]  [facility] [address] [place],   [address] Phone - [phone]   Fax - [phone]   

## 2016-03-01 NOTE — Discharge Instructions (Signed)
Third Trimester of Pregnancy °The third trimester is from week 29 through week 42, months 7 through 9. The third trimester is a time when the fetus is growing rapidly. At the end of the ninth month, the fetus is about 20 inches in length and weighs 6-10 pounds.  °BODY CHANGES °Your body goes through many changes during pregnancy. The changes vary from woman to woman.  °· Your weight will continue to increase. You can expect to gain 25-35 pounds (11-16 kg) by the end of the pregnancy. °· You may begin to get stretch marks on your hips, abdomen, and breasts. °· You may urinate more often because the fetus is moving lower into your pelvis and pressing on your bladder. °· You may develop or continue to have heartburn as a result of your pregnancy. °· You may develop constipation because certain hormones are causing the muscles that push waste through your intestines to slow down. °· You may develop hemorrhoids or swollen, bulging veins (varicose veins). °· You may have pelvic pain because of the weight gain and pregnancy hormones relaxing your joints between the bones in your pelvis. Backaches may result from overexertion of the muscles supporting your posture. °· You may have changes in your hair. These can include thickening of your hair, rapid growth, and changes in texture. Some women also have hair loss during or after pregnancy, or hair that feels dry or thin. Your hair will most likely return to normal after your baby is born. °· Your breasts will continue to grow and be tender. A yellow discharge may leak from your breasts called colostrum. °· Your belly button may stick out. °· You may feel short of breath because of your expanding uterus. °· You may notice the fetus "dropping," or moving lower in your abdomen. °· You may have a bloody mucus discharge. This usually occurs a few days to a week before labor begins. °· Your cervix becomes thin and soft (effaced) near your due date. °WHAT TO EXPECT AT YOUR PRENATAL  EXAMS  °You will have prenatal exams every 2 weeks until week 36. Then, you will have weekly prenatal exams. During a routine prenatal visit: °· You will be weighed to make sure you and the fetus are growing normally. °· Your blood pressure is taken. °· Your abdomen will be measured to track your baby's growth. °· The fetal heartbeat will be listened to. °· Any test results from the previous visit will be discussed. °· You may have a cervical check near your due date to see if you have effaced. °At around 36 weeks, your caregiver will check your cervix. At the same time, your caregiver will also perform a test on the secretions of the vaginal tissue. This test is to determine if a type of bacteria, Group B streptococcus, is present. Your caregiver will explain this further. °Your caregiver may ask you: °· What your birth plan is. °· How you are feeling. °· If you are feeling the baby move. °· If you have had any abnormal symptoms, such as leaking fluid, bleeding, severe headaches, or abdominal cramping. °· If you are using any tobacco products, including cigarettes, chewing tobacco, and electronic cigarettes. °· If you have any questions. °Other tests or screenings that may be performed during your third trimester include: °· Blood tests that check for low iron levels (anemia). °· Fetal testing to check the health, activity level, and growth of the fetus. Testing is done if you have certain medical conditions or if   there are problems during the pregnancy. °· HIV (human immunodeficiency virus) testing. If you are at high risk, you may be screened for HIV during your third trimester of pregnancy. °FALSE LABOR °You may feel small, irregular contractions that eventually go away. These are called Braxton Hicks contractions, or false labor. Contractions may last for hours, days, or even weeks before true labor sets in. If contractions come at regular intervals, intensify, or become painful, it is best to be seen by your  caregiver.  °SIGNS OF LABOR  °· Menstrual-like cramps. °· Contractions that are 5 minutes apart or less. °· Contractions that start on the top of the uterus and spread down to the lower abdomen and back. °· A sense of increased pelvic pressure or back pain. °· A watery or bloody mucus discharge that comes from the vagina. °If you have any of these signs before the 37th week of pregnancy, call your caregiver right away. You need to go to the hospital to get checked immediately. °HOME CARE INSTRUCTIONS  °· Avoid all smoking, herbs, alcohol, and unprescribed drugs. These chemicals affect the formation and growth of the baby. °· Do not use any tobacco products, including cigarettes, chewing tobacco, and electronic cigarettes. If you need help quitting, ask your health care provider. You may receive counseling support and other resources to help you quit. °· Follow your caregiver's instructions regarding medicine use. There are medicines that are either safe or unsafe to take during pregnancy. °· Exercise only as directed by your caregiver. Experiencing uterine cramps is a good sign to stop exercising. °· Continue to eat regular, healthy meals. °· Wear a good support bra for breast tenderness. °· Do not use hot tubs, steam rooms, or saunas. °· Wear your seat belt at all times when driving. °· Avoid raw meat, uncooked cheese, cat litter boxes, and soil used by cats. These carry germs that can cause birth defects in the baby. °· Take your prenatal vitamins. °· Take 1500-2000 mg of calcium daily starting at the 20th week of pregnancy until you deliver your baby. °· Try taking a stool softener (if your caregiver approves) if you develop constipation. Eat more high-fiber foods, such as fresh vegetables or fruit and whole grains. Drink plenty of fluids to keep your urine clear or pale yellow. °· Take warm sitz baths to soothe any pain or discomfort caused by hemorrhoids. Use hemorrhoid cream if your caregiver approves. °· If  you develop varicose veins, wear support hose. Elevate your feet for 15 minutes, 3-4 times a day. Limit salt in your diet. °· Avoid heavy lifting, wear low heal shoes, and practice good posture. °· Rest a lot with your legs elevated if you have leg cramps or low back pain. °· Visit your dentist if you have not gone during your pregnancy. Use a soft toothbrush to brush your teeth and be gentle when you floss. °· A sexual relationship may be continued unless your caregiver directs you otherwise. °· Do not travel far distances unless it is absolutely necessary and only with the approval of your caregiver. °· Take prenatal classes to understand, practice, and ask questions about the labor and delivery. °· Make a trial run to the hospital. °· Pack your hospital bag. °· Prepare the baby's nursery. °· Continue to go to all your prenatal visits as directed by your caregiver. °SEEK MEDICAL CARE IF: °· You are unsure if you are in labor or if your water has broken. °· You have dizziness. °· You have   mild pelvic cramps, pelvic pressure, or nagging pain in your abdominal area. °· You have persistent nausea, vomiting, or diarrhea. °· You have a bad smelling vaginal discharge. °· You have pain with urination. °SEEK IMMEDIATE MEDICAL CARE IF:  °· You have a fever. °· You are leaking fluid from your vagina. °· You have spotting or bleeding from your vagina. °· You have severe abdominal cramping or pain. °· You have rapid weight loss or gain. °· You have shortness of breath with chest pain. °· You notice sudden or extreme swelling of your face, hands, ankles, feet, or legs. °· You have not felt your baby move in over an hour. °· You have severe headaches that do not go away with medicine. °· You have vision changes. °  °This information is not intended to replace advice given to you by your health care provider. Make sure you discuss any questions you have with your health care provider. °  °Document Released: 07/12/2001 Document  Revised: 08/08/2014 Document Reviewed: 09/18/2012 °[employer]vier Interactive Patient Education ©2016 [employer] °Fetal Movement Counts °Patient Name: __________________________________________________ Patient Due Date: ____________________ °Performing a fetal movement count is highly recommended in high-risk pregnancies, but it is good for every pregnant woman to do. Your health care provider may ask you to start counting fetal movements at 28 weeks of the pregnancy. Fetal movements often increase: °· After eating a full meal. °· After physical activity. °· After eating or drinking something sweet or cold. °· At rest. °Pay attention to when you feel the baby is most active. This will help you notice a pattern of your baby's sleep and wake cycles and what factors contribute to an increase in fetal movement. It is important to perform a fetal movement count at the same time each day when your baby is normally most active.  °HOW TO COUNT FETAL MOVEMENTS °1. Find a quiet and comfortable area to sit or lie down on your left side. Lying on your left side provides the best blood and oxygen circulation to your baby. °2. Write down the day and time on a sheet of paper or in a journal. °3. Start counting kicks, flutters, swishes, rolls, or jabs in a 2-hour period. You should feel at least 10 movements within 2 hours. °4. If you do not feel 10 movements in 2 hours, wait 2-3 hours and count again. Look for a change in the pattern or not enough counts in 2 hours. °SEEK MEDICAL CARE IF: °· You feel less than 10 counts in 2 hours, tried twice. °· There is no movement in over an hour. °· The pattern is changing or taking longer each day to reach 10 counts in 2 hours. °· You feel the baby is not moving as he or she usually does. °Date: ____________ Movements: ____________ Start time: ____________ Finish time: ____________  °Date: ____________ Movements: ____________ Start time: ____________ Finish time: ____________ °Date:  ____________ Movements: ____________ Start time: ____________ Finish time: ____________ °Date: ____________ Movements: ____________ Start time: ____________ Finish time: ____________ °Date: ____________ Movements: ____________ Start time: ____________ Finish time: ____________ °Date: ____________ Movements: ____________ Start time: ____________ Finish time: ____________ °Date: ____________ Movements: ____________ Start time: ____________ Finish time: ____________ °Date: ____________ Movements: ____________ Start time: ____________ Finish time: ____________  °Date: ____________ Movements: ____________ Start time: ____________ Finish time: ____________ °Date: ____________ Movements: ____________ Start time: ____________ Finish time: ____________ °Date: ____________ Movements: ____________ Start time: ____________ Finish time: ____________ °Date: ____________ Movements: ____________ Start time: ____________ Finish time: ____________ °Date:   ____________ Movements: ____________ Start time: ____________ Finish time: ____________ °Date: ____________ Movements: ____________ Start time: ____________ Finish time: ____________ °Date: ____________ Movements: ____________ Start time: ____________ Finish time: ____________  °Date: ____________ Movements: ____________ Start time: ____________ Finish time: ____________ °Date: ____________ Movements: ____________ Start time: ____________ Finish time: ____________ °Date: ____________ Movements: ____________ Start time: ____________ Finish time: ____________ °Date: ____________ Movements: ____________ Start time: ____________ Finish time: ____________ °Date: ____________ Movements: ____________ Start time: ____________ Finish time: ____________ °Date: ____________ Movements: ____________ Start time: ____________ Finish time: ____________ °Date: ____________ Movements: ____________ Start time: ____________ Finish time: ____________  °Date: ____________ Movements: ____________ Start  time: ____________ Finish time: ____________ °Date: ____________ Movements: ____________ Start time: ____________ Finish time: ____________ °Date: ____________ Movements: ____________ Start time: ____________ Finish time: ____________ °Date: ____________ Movements: ____________ Start time: ____________ Finish time: ____________ °Date: ____________ Movements: ____________ Start time: ____________ Finish time: ____________ °Date: ____________ Movements: ____________ Start time: ____________ Finish time: ____________ °Date: ____________ Movements: ____________ Start time: ____________ Finish time: ____________  °Date: ____________ Movements: ____________ Start time: ____________ Finish time: ____________ °Date: ____________ Movements: ____________ Start time: ____________ Finish time: ____________ °Date: ____________ Movements: ____________ Start time: ____________ Finish time: ____________ °Date: ____________ Movements: ____________ Start time: ____________ Finish time: ____________ °Date: ____________ Movements: ____________ Start time: ____________ Finish time: ____________ °Date: ____________ Movements: ____________ Start time: ____________ Finish time: ____________ °Date: ____________ Movements: ____________ Start time: ____________ Finish time: ____________  °Date: ____________ Movements: ____________ Start time: ____________ Finish time: ____________ °Date: ____________ Movements: ____________ Start time: ____________ Finish time: ____________ °Date: ____________ Movements: ____________ Start time: ____________ Finish time: ____________ °Date: ____________ Movements: ____________ Start time: ____________ Finish time: ____________ °Date: ____________ Movements: ____________ Start time: ____________ Finish time: ____________ °Date: ____________ Movements: ____________ Start time: ____________ Finish time: ____________ °Date: ____________ Movements: ____________ Start time: ____________ Finish time: ____________    °Date: ____________ Movements: ____________ Start time: ____________ Finish time: ____________ °Date: ____________ Movements: ____________ Start time: ____________ Finish time: ____________ °Date: ____________ Movements: ____________ Start time: ____________ Finish time: ____________ °Date: ____________ Movements: ____________ Start time: ____________ Finish time: ____________ °Date: ____________ Movements: ____________ Start time: ____________ Finish time: ____________ °Date: ____________ Movements: ____________ Start time: ____________ Finish time: ____________ °Date: ____________ Movements: ____________ Start time: ____________ Finish time: ____________  °Date: ____________ Movements: ____________ Start time: ____________ Finish time: ____________ °Date: ____________ Movements: ____________ Start time: ____________ Finish time: ____________ °Date: ____________ Movements: ____________ Start time: ____________ Finish time: ____________ °Date: ____________ Movements: ____________ Start time: ____________ Finish time: ____________ °Date: ____________ Movements: ____________ Start time: ____________ Finish time: ____________ °Date: ____________ Movements: ____________ Start time: ____________ Finish time: ____________ °  °This information is not intended to replace advice given to you by your health care provider. Make sure you discuss any questions you have with your health care provider. °  °Document Released: 08/17/2006 Document Revised: 08/08/2014 Document Reviewed: 05/14/2012 °[employer] Interactive Patient Education ©2016 [employer] °Braxton Hicks Contractions °Contractions of the uterus can occur throughout pregnancy. Contractions are not always a sign that you are in labor.  °WHAT ARE BRAXTON HICKS CONTRACTIONS?  °Contractions that occur before labor are called Braxton Hicks contractions, or false labor. Toward the end of pregnancy (32-34 weeks), these contractions can develop more often and may become more  forceful. This is not true labor because these contractions do not result in opening (dilatation) and thinning of the cervix. They are sometimes difficult to tell apart from true labor because these contractions can be forceful and people have different pain tolerances. You   should not feel embarrassed if you go to the hospital with false labor. Sometimes, the only way to tell if you are in true labor is for your health care provider to look for changes in the cervix. °If there are no prenatal problems or other health problems associated with the pregnancy, it is completely safe to be sent home with false labor and await the onset of true labor. °HOW CAN YOU TELL THE DIFFERENCE BETWEEN TRUE AND FALSE LABOR? °False Labor °· The contractions of false labor are usually shorter and not as hard as those of true labor.   °· The contractions are usually irregular.   °· The contractions are often felt in the front of the lower abdomen and in the groin.   °· The contractions may go away when you walk around or change positions while lying down.   °· The contractions get weaker and are shorter lasting as time goes on.   °· The contractions do not usually become progressively stronger, regular, and closer together as with true labor.   °True Labor °· Contractions in true labor last 30-70 seconds, become very regular, usually become more intense, and increase in frequency.   °· The contractions do not go away with walking.   °· The discomfort is usually felt in the top of the uterus and spreads to the lower abdomen and low back.   °· True labor can be determined by your health care provider with an exam. This will show that the cervix is dilating and getting thinner.   °WHAT TO REMEMBER °· Keep up with your usual exercises and follow other instructions given by your health care provider.   °· Take medicines as directed by your health care provider.   °· Keep your regular prenatal appointments.   °· Eat and drink lightly if you  think you are going into labor.   °· If Braxton Hicks contractions are making you uncomfortable:   °¨ Change your position from lying down or resting to walking, or from walking to resting.   °¨ Sit and rest in a tub of warm water.   °¨ Drink 2-3 glasses of water. Dehydration may cause these contractions.   °¨ Do slow and deep breathing several times an hour.   °WHEN SHOULD I SEEK IMMEDIATE MEDICAL CARE? °Seek immediate medical care if: °· Your contractions become stronger, more regular, and closer together.   °· You have fluid leaking or gushing from your vagina.   °· You have a fever.   °· You pass blood-tinged mucus.   °· You have vaginal bleeding.   °· You have continuous abdominal pain.   °· You have low back pain that you never had before.   °· You feel your baby's head pushing down and causing pelvic pressure.   °· Your baby is not moving as much as it used to.   °  °This information is not intended to replace advice given to you by your health care provider. Make sure you discuss any questions you have with your health care provider. °  °Document Released: 07/18/2005 Document Revised: 07/23/2013 Document Reviewed: 04/29/2013 °Elsevier Interactive Patient Education ©2016 [employer] ° °

## 2016-03-09 ENCOUNTER — Other Ambulatory Visit (HOSPITAL_COMMUNITY)
Admission: RE | Admit: 2016-03-09 | Discharge: 2016-03-09 | Disposition: A | Discharge status: Home / Self Care | Payer: Medicaid Other | Source: Ambulatory Visit | Attending: Family Medicine | Admitting: Family Medicine

## 2016-03-09 ENCOUNTER — Other Ambulatory Visit (HOSPITAL_COMMUNITY): Admission: RE | Admit: 2016-03-09 | Payer: Medicaid Other | Source: Ambulatory Visit | Admitting: Family Medicine

## 2016-03-09 ENCOUNTER — Ambulatory Visit (INDEPENDENT_AMBULATORY_CARE_PROVIDER_SITE_OTHER): Payer: Medicaid Other | Admitting: Family Medicine

## 2016-03-09 ENCOUNTER — Encounter (HOSPITAL_COMMUNITY): Payer: Self-pay

## 2016-03-09 ENCOUNTER — Inpatient Hospital Stay (HOSPITAL_COMMUNITY)
Admission: AD | Admit: 2016-03-09 | Discharge: 2016-03-12 | DRG: 775 | Disposition: A | Discharge status: Home / Self Care | Payer: Medicaid Other | Source: Ambulatory Visit | Attending: Obstetrics and Gynecology | Admitting: Obstetrics and Gynecology

## 2016-03-09 VITALS — BP 138/89 | HR 86 | Wt 128.0 lb

## 2016-03-09 DIAGNOSIS — Z3402 Encounter for supervision of normal first pregnancy, second trimester: Secondary | ICD-10-CM | POA: Diagnosis not present

## 2016-03-09 DIAGNOSIS — Z603 Acculturation difficulty: Secondary | ICD-10-CM

## 2016-03-09 DIAGNOSIS — Z113 Encounter for screening for infections with a predominantly sexual mode of transmission: Secondary | ICD-10-CM | POA: Insufficient documentation

## 2016-03-09 DIAGNOSIS — O0932 Supervision of pregnancy with insufficient antenatal care, second trimester: Secondary | ICD-10-CM | POA: Diagnosis not present

## 2016-03-09 DIAGNOSIS — Z3A39 39 weeks gestation of pregnancy: Secondary | ICD-10-CM

## 2016-03-09 LAB — CBC
HCT: 37.4 % (ref 36.0–46.0)
Hemoglobin: 13.3 g/dL (ref 12.0–15.0)
MCH: 31.4 pg (ref 26.0–34.0)
MCHC: 35.6 g/dL (ref 30.0–36.0)
MCV: 88.4 fL (ref 78.0–100.0)
PLATELETS: 280 10*3/uL (ref 150–400)
RBC: 4.23 MIL/uL (ref 3.87–5.11)
RDW: 13.5 % (ref 11.5–15.5)
WBC: 19.1 10*3/uL — ABNORMAL HIGH (ref 4.0–10.5)

## 2016-03-09 LAB — POCT URINALYSIS DIP (DEVICE)
BILIRUBIN URINE: NEGATIVE
Glucose, UA: NEGATIVE mg/dL
Ketones, ur: NEGATIVE mg/dL
Nitrite: NEGATIVE
PH: 6.5 (ref 5.0–8.0)
Protein, ur: NEGATIVE mg/dL
Specific Gravity, Urine: 1.01 (ref 1.005–1.030)
UROBILINOGEN UA: 0.2 mg/dL (ref 0.0–1.0)

## 2016-03-09 MED ORDER — LACTATED RINGERS IV SOLN
INTRAVENOUS | Status: DC
  Administered 2016-03-09: 125 mL/h via INTRAVENOUS

## 2016-03-09 NOTE — Progress Notes (Signed)
Subjective:  Shannon Davila is a 25 y.o. G1P0 at 6842w3d being seen today for ongoing prenatal care.  She is currently monitored for the following issues for this low-risk pregnancy and has Language barrier, cultural differences; Supervision of normal first pregnancy in second trimester; Late prenatal care affecting pregnancy in second trimester, antepartum; and Fundal height low for dates in third trimester on her problem list.  Patient reports cramping and some vaginal bleeding when wiping after bathroom, light amount of blood..  Contractions: Irregular. Vag. Bleeding: Small.  Movement: Present. Denies leaking of fluid.   The following portions of the patient's history were reviewed and updated as appropriate: allergies, current medications, past family history, past medical history, past social history, past surgical history and problem list. Problem list updated.  Objective:   Vitals:   03/09/16 0925  BP: 138/89  Pulse: 86  Weight: 128 lb (58.1 kg)    Fetal Status: Fetal Heart Rate (bpm): 139   Movement: Present     General:  Alert, oriented and cooperative. Patient is in no acute distress.  Skin: Skin is warm and dry. No rash noted.   Cardiovascular: Normal heart rate noted  Respiratory: Normal respiratory effort, no problems with respiration noted  Abdomen: Soft, gravid, appropriate for gestational age. Pain/Pressure: Present     Pelvic:  Cervical exam performed        Extremities: Normal range of motion.  Edema: None  Mental Status: Normal mood and affect. Normal behavior. Normal judgment and thought content.   Urinalysis:     +Hb, trace leuks  Assessment and Plan:  Pregnancy: G1P0 at 6042w3d  1. Supervision of normal first pregnancy in second trimester - Culture, beta strep (group b only) - GC/Chlamydia Probe Amp - Return in 1 week for visit and NST  2. Late prenatal care affecting pregnancy in second trimester, antepartum  3. Language barrier, cultural differences   Term  labor symptoms and general obstetric precautions including but not limited to vaginal bleeding, contractions, leaking of fluid and fetal movement were reviewed in detail with the patient. Please refer to After Visit Summary for other counseling recommendations.   Return in about 1 week (around 03/16/2016) for Routine OB visit & NST; Please give list of pediatricians and circumcision offices.   [facility], LLCElizabeth Woodland Clyde Davila, [place]

## 2016-03-09 NOTE — Patient Instructions (Addendum)
Vaginal Bleeding During Pregnancy, Third Trimester  A small amount of bleeding (spotting) from the vagina is common in pregnancy. Sometimes the bleeding is normal and is not a problem, and sometimes it is a sign of something serious. Be sure to tell your doctor about any bleeding from your vagina right away. HOME CARE  Watch your condition for any changes.  Follow your doctor's instructions about how active you can be.  If you are on bed rest:  You may need to stay in bed and only get up to use the bathroom.  You may be allowed to do some activities.  If you need help, make plans for someone to help you.  Write down:  The number of pads you use each day.  How often you change pads.  How soaked (saturated) your pads are.  Do not use tampons.  Do not douche.  Do not have sex or orgasms until your doctor says it is okay.  Follow your doctor's advice about lifting, driving, and doing physical activities.  If you pass any tissue from your vagina, save the tissue so you can show it to your doctor.  Only take medicines as told by your doctor.  Do not take aspirin because it can make you bleed.  Keep all follow-up visits as told by your doctor. GET HELP IF:   You bleed from your vagina.  You have cramps.  You have labor pains.  You have a fever that does not go away after you take medicine. GET HELP RIGHT AWAY IF:  You have very bad cramps in your back or belly (abdomen).  You have chills.  You have a gush of fluid from your vagina.  You pass large clots or tissue from your vagina.  You bleed more.  You feel light-headed or weak.  You pass out (faint).  You do not feel your baby move around as much as before. MAKE SURE YOU:  Understand these instructions.  Will watch your condition.  Will get help right away if you are not doing well or get worse.   This information is not intended to replace advice given to you by your health care provider. Make sure  you discuss any questions you have with your health care provider.   Document Released: 12/02/2013 Document Reviewed: 12/02/2013 Elsevier Interactive Patient Education [employer]   Third Trimester of Pregnancy The third trimester is from week 29 through week 42, months 7 through 9. The third trimester is a time when the fetus is growing rapidly. At the end of the ninth month, the fetus is about 20 inches in length and weighs 6-10 pounds.  BODY CHANGES Your body goes through many changes during pregnancy. The changes vary from woman to woman.   Your weight will continue to increase. You can expect to gain 25-35 pounds (11-16 kg) by the end of the pregnancy.  You may begin to get stretch marks on your hips, abdomen, and breasts.  You may urinate more often because the fetus is moving lower into your pelvis and pressing on your bladder.  You may develop or continue to have heartburn as a result of your pregnancy.  You may develop constipation because certain hormones are causing the muscles that push waste through your intestines to slow down.  You may develop hemorrhoids or swollen, bulging veins (varicose veins).  You may have pelvic pain because of the weight gain and pregnancy hormones relaxing your joints between the bones in your pelvis. Backaches  may result from overexertion of the muscles supporting your posture.  You may have changes in your hair. These can include thickening of your hair, rapid growth, and changes in texture. Some women also have hair loss during or after pregnancy, or hair that feels dry or thin. Your hair will most likely return to normal after your baby is born.  Your breasts will continue to grow and be tender. A yellow discharge may leak from your breasts called colostrum.  Your belly button may stick out.  You may feel short of breath because of your expanding uterus.  You may notice the fetus "dropping," or moving lower in your abdomen.  You  may have a bloody mucus discharge. This usually occurs a few days to a week before labor begins.  Your cervix becomes thin and soft (effaced) near your due date. WHAT TO EXPECT AT YOUR PRENATAL EXAMS  You will have prenatal exams every 2 weeks until week 36. Then, you will have weekly prenatal exams. During a routine prenatal visit:  You will be weighed to make sure you and the fetus are growing normally.  Your blood pressure is taken.  Your abdomen will be measured to track your baby's growth.  The fetal heartbeat will be listened to.  Any test results from the previous visit will be discussed.  You may have a cervical check near your due date to see if you have effaced. At around 36 weeks, your caregiver will check your cervix. At the same time, your caregiver will also perform a test on the secretions of the vaginal tissue. This test is to determine if a type of bacteria, Group B streptococcus, is present. Your caregiver will explain this further. Your caregiver may ask you:  What your birth plan is.  How you are feeling.  If you are feeling the baby move.  If you have had any abnormal symptoms, such as leaking fluid, bleeding, severe headaches, or abdominal cramping.  If you are using any tobacco products, including cigarettes, chewing tobacco, and electronic cigarettes.  If you have any questions. Other tests or screenings that may be performed during your third trimester include:  Blood tests that check for low iron levels (anemia).  Fetal testing to check the health, activity level, and growth of the fetus. Testing is done if you have certain medical conditions or if there are problems during the pregnancy.  HIV (human immunodeficiency virus) testing. If you are at high risk, you may be screened for HIV during your third trimester of pregnancy. FALSE LABOR You may feel small, irregular contractions that eventually go away. These are called Braxton Hicks contractions, or  false labor. Contractions may last for hours, days, or even weeks before true labor sets in. If contractions come at regular intervals, intensify, or become painful, it is best to be seen by your caregiver.  SIGNS OF LABOR   Menstrual-like cramps.  Contractions that are 5 minutes apart or less.  Contractions that start on the top of the uterus and spread down to the lower abdomen and back.  A sense of increased pelvic pressure or back pain.  A watery or bloody mucus discharge that comes from the vagina. If you have any of these signs before the 37th week of pregnancy, call your caregiver right away. You need to go to the hospital to get checked immediately. HOME CARE INSTRUCTIONS   Avoid all smoking, herbs, alcohol, and unprescribed drugs. These chemicals affect the formation and growth of the baby.  Do not use any tobacco products, including cigarettes, chewing tobacco, and electronic cigarettes. If you need help quitting, ask your health care provider. You may receive counseling support and other resources to help you quit.  Follow your caregiver's instructions regarding medicine use. There are medicines that are either safe or unsafe to take during pregnancy.  Exercise only as directed by your caregiver. Experiencing uterine cramps is a good sign to stop exercising.  Continue to eat regular, healthy meals.  Wear a good support bra for breast tenderness.  Do not use hot tubs, steam rooms, or saunas.  Wear your seat belt at all times when driving.  Avoid raw meat, uncooked cheese, cat litter boxes, and soil used by cats. These carry germs that can cause birth defects in the baby.  Take your prenatal vitamins.  Take 1500-2000 mg of calcium daily starting at the 20th week of pregnancy until you deliver your baby.  Try taking a stool softener (if your caregiver approves) if you develop constipation. Eat more high-fiber foods, such as fresh vegetables or fruit and whole grains.  Drink plenty of fluids to keep your urine clear or pale yellow.  Take warm sitz baths to soothe any pain or discomfort caused by hemorrhoids. Use hemorrhoid cream if your caregiver approves.  If you develop varicose veins, wear support hose. Elevate your feet for 15 minutes, 3-4 times a day. Limit salt in your diet.  Avoid heavy lifting, wear low heal shoes, and practice good posture.  Rest a lot with your legs elevated if you have leg cramps or low back pain.  Visit your dentist if you have not gone during your pregnancy. Use a soft toothbrush to brush your teeth and be gentle when you floss.  A sexual relationship may be continued unless your caregiver directs you otherwise.  Do not travel far distances unless it is absolutely necessary and only with the approval of your caregiver.  Take prenatal classes to understand, practice, and ask questions about the labor and delivery.  Make a trial run to the hospital.  Pack your hospital bag.  Prepare the baby's nursery.  Continue to go to all your prenatal visits as directed by your caregiver. SEEK MEDICAL CARE IF:  You are unsure if you are in labor or if your water has broken.  You have dizziness.  You have mild pelvic cramps, pelvic pressure, or nagging pain in your abdominal area.  You have persistent nausea, vomiting, or diarrhea.  You have a bad smelling vaginal discharge.  You have pain with urination. SEEK IMMEDIATE MEDICAL CARE IF:   You have a fever.  You are leaking fluid from your vagina.  You have spotting or bleeding from your vagina.  You have severe abdominal cramping or pain.  You have rapid weight loss or gain.  You have shortness of breath with chest pain.  You notice sudden or extreme swelling of your face, hands, ankles, feet, or legs.  You have not felt your baby move in over an hour.  You have severe headaches that do not go away with medicine.  You have vision changes.   This information  is not intended to replace advice given to you by your health care provider. Make sure you discuss any questions you have with your health care provider.   Document Released: 07/12/2001 Document Revised: 08/08/2014 Document Reviewed: 09/18/2012 Elsevier Interactive Patient Education [employer] Inc[employer]

## 2016-03-10 ENCOUNTER — Inpatient Hospital Stay (HOSPITAL_COMMUNITY): Payer: Medicaid Other | Admitting: Anesthesiology

## 2016-03-10 ENCOUNTER — Encounter (HOSPITAL_COMMUNITY): Payer: Self-pay

## 2016-03-10 DIAGNOSIS — Z3A39 39 weeks gestation of pregnancy: Secondary | ICD-10-CM

## 2016-03-10 DIAGNOSIS — Z3403 Encounter for supervision of normal first pregnancy, third trimester: Secondary | ICD-10-CM | POA: Diagnosis present

## 2016-03-10 LAB — TYPE AND SCREEN
ABO/RH(D): B POS
ANTIBODY SCREEN: NEGATIVE

## 2016-03-10 LAB — ABO/RH: ABO/RH(D): B POS

## 2016-03-10 LAB — GC/CHLAMYDIA PROBE AMP (~~LOC~~) NOT AT ARMC
CHLAMYDIA, DNA PROBE: NEGATIVE
NEISSERIA GONORRHEA: NEGATIVE

## 2016-03-10 LAB — RPR: RPR Ser Ql: NONREACTIVE

## 2016-03-10 LAB — GROUP B STREP BY PCR: GROUP B STREP BY PCR: NEGATIVE

## 2016-03-10 MED ORDER — FENTANYL 2.5 MCG/ML BUPIVACAINE 1/10 % EPIDURAL INFUSION (WH - ANES)
INTRAMUSCULAR | Status: AC
  Filled 2016-03-10: qty 125

## 2016-03-10 MED ORDER — ONDANSETRON HCL 4 MG/2ML IJ SOLN: 4.0000 mg | Freq: Four times a day (QID) | INTRAMUSCULAR | Status: DC | PRN

## 2016-03-10 MED ORDER — DIBUCAINE 1 % RE OINT: 1.0000 "application " | TOPICAL_OINTMENT | RECTAL | Status: DC | PRN

## 2016-03-10 MED ORDER — OXYTOCIN BOLUS FROM INFUSION
500.0000 mL | Freq: Once | INTRAVENOUS | Status: AC
  Administered 2016-03-10: 500 mL via INTRAVENOUS

## 2016-03-10 MED ORDER — LIDOCAINE HCL (PF) 1 % IJ SOLN
INTRAMUSCULAR | Status: DC | PRN
  Administered 2016-03-10 (×2): 6 mL

## 2016-03-10 MED ORDER — DIPHENHYDRAMINE HCL 50 MG/ML IJ SOLN: 12.5000 mg | INTRAMUSCULAR | Status: DC | PRN

## 2016-03-10 MED ORDER — ACETAMINOPHEN 325 MG PO TABS: 650.0000 mg | ORAL_TABLET | ORAL | Status: DC | PRN

## 2016-03-10 MED ORDER — ONDANSETRON HCL 4 MG/2ML IJ SOLN: 4.0000 mg | INTRAMUSCULAR | Status: DC | PRN

## 2016-03-10 MED ORDER — OXYCODONE-ACETAMINOPHEN 5-325 MG PO TABS: 1.0000 | ORAL_TABLET | ORAL | Status: DC | PRN

## 2016-03-10 MED ORDER — FENTANYL CITRATE (PF) 100 MCG/2ML IJ SOLN
100.0000 ug | INTRAMUSCULAR | Status: DC | PRN
Start: 2016-03-10 — End: 2016-03-10

## 2016-03-10 MED ORDER — OXYTOCIN 40 UNITS IN LACTATED RINGERS INFUSION - SIMPLE MED
2.5000 [IU]/h | INTRAVENOUS | Status: DC
  Filled 2016-03-10: qty 1000

## 2016-03-10 MED ORDER — ONDANSETRON HCL 4 MG PO TABS: 4.0000 mg | ORAL_TABLET | ORAL | Status: DC | PRN

## 2016-03-10 MED ORDER — SOD CITRATE-CITRIC ACID 500-334 MG/5ML PO SOLN: 30.0000 mL | ORAL | Status: DC | PRN

## 2016-03-10 MED ORDER — IBUPROFEN 600 MG PO TABS
600.0000 mg | ORAL_TABLET | Freq: Four times a day (QID) | ORAL | Status: DC
  Administered 2016-03-10 – 2016-03-12 (×9): 600 mg via ORAL
  Filled 2016-03-10 (×9): qty 1

## 2016-03-10 MED ORDER — PHENYLEPHRINE 40 MCG/ML (10ML) SYRINGE FOR IV PUSH (FOR BLOOD PRESSURE SUPPORT)
80.0000 ug | PREFILLED_SYRINGE | INTRAVENOUS | Status: DC | PRN
  Filled 2016-03-10: qty 5

## 2016-03-10 MED ORDER — FENTANYL 2.5 MCG/ML BUPIVACAINE 1/10 % EPIDURAL INFUSION (WH - ANES)
14.0000 mL/h | INTRAMUSCULAR | Status: DC | PRN
  Administered 2016-03-10: 14 mL/h via EPIDURAL

## 2016-03-10 MED ORDER — WITCH HAZEL-GLYCERIN EX PADS: 1.0000 "application " | MEDICATED_PAD | CUTANEOUS | Status: DC | PRN

## 2016-03-10 MED ORDER — TETANUS-DIPHTH-ACELL PERTUSSIS 5-2.5-18.5 LF-MCG/0.5 IM SUSP: 0.5000 mL | Freq: Once | INTRAMUSCULAR | Status: DC

## 2016-03-10 MED ORDER — PRENATAL MULTIVITAMIN CH
1.0000 | ORAL_TABLET | Freq: Every day | ORAL | Status: DC
  Administered 2016-03-10 – 2016-03-12 (×3): 1 via ORAL
  Filled 2016-03-10 (×3): qty 1

## 2016-03-10 MED ORDER — OXYCODONE-ACETAMINOPHEN 5-325 MG PO TABS: 2.0000 | ORAL_TABLET | ORAL | Status: DC | PRN

## 2016-03-10 MED ORDER — PHENYLEPHRINE 40 MCG/ML (10ML) SYRINGE FOR IV PUSH (FOR BLOOD PRESSURE SUPPORT)
PREFILLED_SYRINGE | INTRAVENOUS | Status: AC
  Filled 2016-03-10: qty 20

## 2016-03-10 MED ORDER — FLEET ENEMA 7-19 GM/118ML RE ENEM: 1.0000 | ENEMA | RECTAL | Status: DC | PRN

## 2016-03-10 MED ORDER — DIPHENHYDRAMINE HCL 25 MG PO CAPS: 25.0000 mg | ORAL_CAPSULE | Freq: Four times a day (QID) | ORAL | Status: DC | PRN

## 2016-03-10 MED ORDER — EPHEDRINE 5 MG/ML INJ
10.0000 mg | INTRAVENOUS | Status: DC | PRN
  Filled 2016-03-10: qty 4

## 2016-03-10 MED ORDER — COCONUT OIL OIL
1.0000 "application " | TOPICAL_OIL | Status: DC | PRN
  Administered 2016-03-10: 1 via TOPICAL
  Filled 2016-03-10: qty 120

## 2016-03-10 MED ORDER — LIDOCAINE HCL (PF) 1 % IJ SOLN
30.0000 mL | INTRAMUSCULAR | Status: DC | PRN
  Filled 2016-03-10: qty 30

## 2016-03-10 MED ORDER — LACTATED RINGERS IV SOLN: 500.0000 mL | Freq: Once | INTRAVENOUS | Status: DC

## 2016-03-10 MED ORDER — SIMETHICONE 80 MG PO CHEW: 80.0000 mg | CHEWABLE_TABLET | ORAL | Status: DC | PRN

## 2016-03-10 MED ORDER — SENNOSIDES-DOCUSATE SODIUM 8.6-50 MG PO TABS
2.0000 | ORAL_TABLET | ORAL | Status: DC
  Administered 2016-03-10 – 2016-03-12 (×2): 2 via ORAL
  Filled 2016-03-10 (×2): qty 2

## 2016-03-10 MED ORDER — ZOLPIDEM TARTRATE 5 MG PO TABS: 5.0000 mg | ORAL_TABLET | Freq: Every evening | ORAL | Status: DC | PRN

## 2016-03-10 MED ORDER — BENZOCAINE-MENTHOL 20-0.5 % EX AERO
1.0000 "application " | INHALATION_SPRAY | CUTANEOUS | Status: DC | PRN
  Administered 2016-03-10: 1 via TOPICAL
  Filled 2016-03-10: qty 56

## 2016-03-10 MED ORDER — LACTATED RINGERS IV SOLN: 500.0000 mL | INTRAVENOUS | Status: DC | PRN

## 2016-03-10 NOTE — Progress Notes (Signed)
UR chart review completed.  

## 2016-03-10 NOTE — Anesthesia Postprocedure Evaluation (Signed)
Anesthesia Post Note  Patient: [name] [employer] Procedure(s) Performed: * No procedures listed *  Patient location during evaluation: Mother Baby Anesthesia Type: Epidural Level of consciousness: awake and alert Pain management: pain level controlled Vital Signs Assessment: post-procedure vital signs reviewed and stable Respiratory status: spontaneous breathing and nonlabored ventilation Cardiovascular status: stable Postop Assessment: no headache, patient able to bend at knees, no backache, no signs of nausea or vomiting, epidural receding and adequate PO intake Anesthetic complications: no     Last Vitals:  Vitals:   03/10/16 0850 03/10/16 0950  BP: 114/67 112/72  Pulse: 77 72  Resp: 18 18  Temp: 37.2 C 36.5 C    Last Pain:  Vitals:   03/10/16 1132  TempSrc:   PainSc: 0-No pain   Pain Goal: Patients Stated Pain Goal: 8 (03/10/16 0105)               [name],[name]

## 2016-03-10 NOTE — Anesthesia Procedure Notes (Signed)
Epidural Patient location during procedure: OB  Staffing Anesthesiologist: Shannon Davila, Shannon Davila  Preanesthetic Checklist Completed: patient identified, site marked, surgical consent, pre-op evaluation, timeout performed, IV checked, risks and benefits discussed and monitors and equipment checked  Epidural Patient position: sitting Prep: DuraPrep Patient monitoring: blood pressure and heart rate Approach: midline Location: L3-L4 Injection technique: LOR saline  Needle:  Needle type: Tuohy  Needle gauge: 17 G Needle length: 9 cm Needle insertion depth: 3.5 cm Catheter type: closed end flexible Catheter size: 19 Gauge Catheter at skin depth: 11 cm Test dose: negative and Other  Assessment Events: blood not aspirated, injection not painful, no injection resistance, negative IV test and no paresthesia  Additional Notes Reason for block:procedure for pain

## 2016-03-10 NOTE — Lactation Note (Signed)
This note was copied from a baby's chart. Lactation Consultation Note  Patient Name: Shannon Davila ZOXWR'UToday's Date: 03/10/2016 Reason for consult: Initial assessment [facility] Baby demonstrating some good suckling bursts. Basic teaching reviewed, encouraged to continue to BF with feeding ques. Lactation brochure left for review, advised of OP services and support group. Encouraged to call for assist as needed. FOB present to interpret but Mom understands some English.   Maternal Data Has patient been taught Hand Expression?: Yes Does the patient have breastfeeding experience prior to this delivery?: No  Feeding Feeding Type: Breast Fed Length of feed: 15 min  LATCH Score/Interventions Latch: Grasps breast easily, tongue down, lips flanged, rhythmical sucking.  Audible Swallowing: A few with stimulation  Type of Nipple: Everted at rest and after stimulation  Comfort (Breast/Nipple): Soft / non-tender     Hold (Positioning): No assistance needed to correctly position infant at breast. Intervention(s): Breastfeeding basics reviewed;Support Pillows;Position options;Skin to skin  LATCH Score: 9  Lactation Tools Discussed/Used     Consult Status Consult Status: Follow-up Date: 03/11/16 Follow-up type: In-patient    [name], [name] 03/10/2016, 3:46 PM

## 2016-03-10 NOTE — Progress Notes (Signed)
Patient ID: Shannon Davila, female   DOB: 04/08/1991, 25 y.o.   MRN: [id number]  Pushing x 1hr now  VSS, afeb FHR 150s, early variables w/ pushing Ctx q 2-3 mins Vtx beginning to be visible at introitus w/ pushing  Continue pushing- anticipate SVD  Cam HaiSHAW, KIMBERLY CNM 03/10/2016 5:48 AM

## 2016-03-10 NOTE — Anesthesia Preprocedure Evaluation (Signed)
Anesthesia Evaluation  Patient identified by MRN, date of birth, ID band Patient awake, Patient confused and Patient unresponsive    Reviewed: Allergy & Precautions, NPO status , Patient's Chart, lab work & pertinent test results  Airway Mallampati: II  TM Distance: >3 FB Neck ROM: Full    Dental no notable dental hx.    Pulmonary neg pulmonary ROS,    Pulmonary exam normal breath sounds clear to auscultation       Cardiovascular negative cardio ROS Normal cardiovascular exam Rhythm:Regular Rate:Normal     Neuro/Psych negative neurological ROS  negative psych ROS   GI/Hepatic negative GI ROS, Neg liver ROS,   Endo/Other  negative endocrine ROS  Renal/GU negative Renal ROS  negative genitourinary   Musculoskeletal negative musculoskeletal ROS (+)   Abdominal   Peds negative pediatric ROS (+)  Hematology negative hematology ROS (+)   Anesthesia Other Findings   Reproductive/Obstetrics negative OB ROS                             Anesthesia Physical Anesthesia Plan  ASA: II  Anesthesia Plan: Epidural   Post-op Pain Management:    Induction: Intravenous  Airway Management Planned: Natural Airway  Additional Equipment:   Intra-op Plan:   Post-operative Plan:   Informed Consent: I have reviewed the patients History and Physical, chart, labs and discussed the procedure including the risks, benefits and alternatives for the proposed anesthesia with the patient or authorized representative who has indicated his/her understanding and acceptance.   Dental advisory given  Plan Discussed with: CRNA  Anesthesia Plan Comments: (Informed consent obtained prior to proceeding including risk of failure, 1% risk of PDPH, risk of minor discomfort and bruising.  Discussed rare but serious complications including epidural abscess, permanent nerve injury, epidural hematoma.  Discussed alternatives  to epidural analgesia and patient desires to proceed.  Timeout performed pre-procedure verifying patient name, procedure, and platelet count.  Patient tolerated procedure well.)        Anesthesia Quick Evaluation

## 2016-03-10 NOTE — H&P (Signed)
Shannon Davila is a 25 y.o. female G1 @ 39.4wks by LMP presenting for reg ctx. Denies leaking or bldg. [facility] and has been essentially unremarkable other than 1) language barrier. EFW was 76% in June.   OB History    Gravida Para Term Preterm AB Living   1             SAB TAB Ectopic Multiple Live Births                 History reviewed. No pertinent past medical history. History reviewed. No pertinent surgical history. Family History: family history is not on file. Social History:  reports that she has never smoked. She has never used smokeless tobacco. She reports that she does not drink alcohol or use drugs.     Maternal Diabetes: No Genetic Screening: Declined Maternal Ultrasounds/Referrals: Normal Fetal Ultrasounds or other Referrals:  None Maternal Substance Abuse:  No Significant Maternal Medications:  None Significant Maternal Lab Results:  Lab values include: Group B Strep negative Other Comments:  None  ROS History Dilation: 6 Effacement (%): 100 Station: -2 Exam by:: Camelia Enganielle Simpson RN Blood pressure 118/81, pulse 89, temperature 98.6 F (37 C), temperature source Oral, resp. rate 18, last menstrual period 06/07/2015, SpO2 100 %. Exam Physical Exam  Constitutional: She is oriented to person, place, and time. She appears well-developed.  HENT:  Head: Normocephalic.  Neck: Normal range of motion.  Cardiovascular: Normal rate.   Respiratory: Effort normal.  GI:  EFM 140s, +accels, no decels, occ mi variables Ctx q 2-3 mins, spont  Musculoskeletal: Normal range of motion.  Neurological: She is alert and oriented to person, place, and time.  Skin: Skin is warm and dry.  Psychiatric: She has a normal mood and affect. Her behavior is normal. Thought content normal.    Prenatal labs: ABO, Rh: B/POS/-- (04/05 1031) Antibody: NEG (04/05 1031) Rubella: 11.50 (04/05 1031) RPR: NON REAC (06/09 1035)  HBsAg: NEGATIVE (04/05 1031)  HIV:  NONREACTIVE (06/09 1035)  GBS:     Assessment/Plan: IUP@term  Early active labor GBS PCR neg  [facility] Plans epidural Anticipate SVD   SHAW, KIMBERLY CNM 03/10/2016, 12:06 AM

## 2016-03-11 LAB — CULTURE, BETA STREP (GROUP B ONLY)

## 2016-03-11 NOTE — Progress Notes (Signed)
Post Partum Day 1  Subjective:  Shannon Davila is a 25 y.o. G1P1001 1843w4d s/p SVD.  No acute events overnight.  Pt denies problems with ambulating, voiding or po intake.  She denies nausea or vomiting.  Pain is well controlled.  She has had flatus. She has not had bowel movement.  Lochia Small.  Plan for birth control is nexplanon.  Method of Feeding: breast  Objective: BP 105/80   Pulse 89   Temp 97.9 F (36.6 C) (Oral)   Resp 18   Ht 5\' 2"  (1.575 m)   Wt 57.2 kg (126 lb)   LMP 06/07/2015   SpO2 100%   Breastfeeding? Unknown   BMI 23.05 kg/m   Physical Exam:  General: alert, cooperative and no distress Lochia:normal flow Chest: CTAB Heart: RRR no m/r/g Abdomen: +BS, soft, nontender, fundus firm at/below umbilicus Uterine Fundus: firm, nontender DVT Evaluation: No evidence of DVT seen on physical exam. Extremities: no edema   Recent Labs  03/09/16 2330  HGB 13.3  HCT 37.4    Assessment/Plan:  ASSESSMENT: Shannon Davila is a 25 y.o. G1P1001 6643w4d ppd #1 s/p NSVD doing well.   Plan for discharge tomorrow and Breastfeeding   LOS: 1 day   Loni MuseKate Timberlake 03/11/2016, 7:48 AM   CNM attestation Post Partum Day #1 I have seen and examined this patient and agree with above documentation in the resident's note.   Shannon Davila is a 25 y.o. G1P1001 s/p SVD.  Pt denies problems with ambulating, voiding or po intake. Pain is well controlled.  Plan for birth control is Nexplanon.  Method of Feeding: breast  PE:  BP 105/85 (BP Location: Left Arm)   Pulse 86   Temp 97.8 F (36.6 C) (Oral)   Resp 18   Ht 5\' 2"  (1.575 m)   Wt 57.2 kg (126 lb)   LMP 06/07/2015   SpO2 100%   Breastfeeding? Unknown   BMI 23.05 kg/m  Fundus firm  Plan for discharge: 03/12/16  SHAW, Cala BradfordKIMBERLY, CNM 1:10 AM

## 2016-03-11 NOTE — Lactation Note (Signed)
This note was copied from a baby's chart. Lactation Consultation Note; Asked by RN to give mom larger flanges for manual pump. Mom reports breasts are feeling fuller today. #27 and #30 flange given Reviewed engorgement prevention and treatment. Encouraged frequent nursing with feeding cues. Baby asleep at mom's side. Last LS 10 by RN. No questions at present. To call for assist prn  Patient Name: Shannon Davila ZOXWR'UToday's Date: 03/11/2016 Reason for consult: Follow-up assessment   Maternal Data Formula Feeding for Exclusion: Yes Reason for exclusion: Mother's choice to formula and breast feed on admission Has patient been taught Hand Expression?: Yes Does the patient have breastfeeding experience prior to this delivery?: No  Feeding Feeding Type: Breast Fed Length of feed: 20 min  LATCH Score/Interventions Latch: Grasps breast easily, tongue down, lips flanged, rhythmical sucking.  Audible Swallowing: Spontaneous and intermittent Intervention(s): Hand expression  Type of Nipple: Everted at rest and after stimulation  Comfort (Breast/Nipple): Filling, red/small blisters or bruises, mild/mod discomfort  Problem noted: Filling;Mild/Moderate discomfort Interventions (Filling): Frequent nursing;Hand pump Interventions (Mild/moderate discomfort): Hand expression  Hold (Positioning): No assistance needed to correctly position infant at breast. Intervention(s): Breastfeeding basics reviewed  LATCH Score: 10  Lactation Tools Discussed/Used WIC Program: Yes Pump Review: Setup, frequency, and cleaning;Milk Storage Initiated by:: Christella HartiganBeverly Daly, RN Date initiated:: 03/11/16   Consult Status Consult Status: Follow-up Date: 03/12/16 Follow-up type: In-patient    Pamelia HoitWeeks, Shannon Davila 03/11/2016, 10:21 AM

## 2016-03-12 MED ORDER — DOCUSATE SODIUM 100 MG PO CAPS: 100.0000 mg | ORAL_CAPSULE | Freq: Two times a day (BID) | ORAL | 0 refills | Status: AC

## 2016-03-12 MED ORDER — IBUPROFEN 600 MG PO TABS
600.0000 mg | ORAL_TABLET | Freq: Four times a day (QID) | ORAL | 0 refills | Status: DC
Start: 2016-03-12 — End: 2016-05-17

## 2016-03-12 NOTE — Discharge Summary (Signed)
    OB Discharge Summary     Patient Name: Shannon Davila DOB: 07/12/1991 MRN: [id number]  Date of admission: 03/09/2016 Delivering MD: Cam HaiSHAW, KIMBERLY D   Date of discharge: 03/12/2016  Admitting diagnosis: 39w feel the pain Intrauterine pregnancy: 7575w4d     Secondary diagnosis:  Active Problems:   Indication for care in labor or delivery  Additional problems: None     Discharge diagnosis: Term Pregnancy Delivered                                                                                                Post partum procedures: None  Augmentation: None  Complications: None  Hospital course:  Onset of Labor With Vaginal Delivery     25 y.o. yo G1P1001 at 4475w4d was admitted in Active Labor on 03/09/2016. Patient had an uncomplicated labor course as follows:  Membrane Rupture Time/Date: 4:40 AM ,03/10/2016   Intrapartum Procedures: Episiotomy: None [1]                                         Lacerations:  None [1]  Patient had a delivery of a Viable infant. 03/10/2016  Information for the patient's newborn:  Feliberto HartsSeng, Boy Margarete [[id number]][[id number]]  Delivery Method: Vag-Spont    Pateint had an uncomplicated postpartum course.  She is ambulating, tolerating a regular diet, passing flatus, and urinating well. Patient is discharged home in stable condition on 03/12/16.    Physical exam Vitals:   03/11/16 0150 03/11/16 0539 03/11/16 1700 03/12/16 0020  BP: 113/69 105/80 105/85 110/71  Pulse: 81 89 86 80  Resp: 18 18 18 18   Temp: 98 F (36.7 C) 97.9 F (36.6 C) 97.8 F (36.6 C) 98.3 F (36.8 C)  TempSrc: Oral Oral Oral Oral  SpO2: 100%     Weight:      Height:       General: alert, cooperative and no distress Lochia: appropriate Uterine Fundus: firm Incision: N/A DVT Evaluation: No evidence of DVT seen on physical exam. Negative Homan's sign. No cords or calf tenderness. No significant calf/ankle edema. Labs: Lab Results  Component Value Date   WBC 19.1 (H) 03/09/2016   HGB  13.3 03/09/2016   HCT 37.4 03/09/2016   MCV 88.4 03/09/2016   PLT 280 03/09/2016   No flowsheet data found.  Discharge instruction: per After Visit Summary and "Baby and Me Booklet".  After visit meds:  Ibuprofen and Colace   Diet: routine diet  Activity: Advance as tolerated. Pelvic rest for 6 weeks.   Outpatient follow up:6 weeks Follow up Appt:No future appointments. Follow up Visit:No Follow-up on file.  Postpartum contraception: Nexplanon  Newborn Data: Live born female  Birth Weight: 7 lb 1.8 oz (3225 g) APGAR: 8, 8  Baby Feeding: Breast Disposition:home with mother   03/12/2016 Jen MowElizabeth Mumaw, DO

## 2016-03-12 NOTE — Discharge Instructions (Signed)
Postpartum Care After Vaginal Delivery °After you deliver your newborn (postpartum period), the usual stay in the hospital is 24-72 hours. If there were problems with your labor or delivery, or if you have other medical problems, you might be in the hospital longer.  °While you are in the hospital, you will receive help and instructions on how to care for yourself and your newborn during the postpartum period.  °While you are in the hospital: °· Be sure to tell your nurses if you have pain or discomfort, as well as where you feel the pain and what makes the pain worse. °· If you had an incision made near your vagina (episiotomy) or if you had some tearing during delivery, the nurses may put ice packs on your episiotomy or tear. The ice packs may help to reduce the pain and swelling. °· If you are breastfeeding, you may feel uncomfortable contractions of your uterus for a couple of weeks. This is normal. The contractions help your uterus get back to normal size. °· It is normal to have some bleeding after delivery. °¨ For the first 1-3 days after delivery, the flow is red and the amount may be similar to a period. °¨ It is common for the flow to start and stop. °¨ In the first few days, you may pass some small clots. Let your nurses know if you begin to pass large clots or your flow increases. °¨ Do not  flush blood clots down the toilet before having the nurse look at them. °¨ During the next 3-10 days after delivery, your flow should become more watery and pink or brown-tinged in color. °¨ Ten to fourteen days after delivery, your flow should be a small amount of yellowish-white discharge. °¨ The amount of your flow will decrease over the first few weeks after delivery. Your flow may stop in 6-8 weeks. Most women have had their flow stop by 12 weeks after delivery. °· You should change your sanitary pads frequently. °· Wash your hands thoroughly with soap and water for at least 20 seconds after changing pads, using  the toilet, or before holding or feeding your newborn. °· You should feel like you need to empty your bladder within the first 6-8 hours after delivery. °· In case you become weak, lightheaded, or faint, call your nurse before you get out of bed for the first time and before you take a shower for the first time. °· Within the first few days after delivery, your breasts may begin to feel tender and full. This is called engorgement. Breast tenderness usually goes away within 48-72 hours after engorgement occurs. You may also notice milk leaking from your breasts. If you are not breastfeeding, do not stimulate your breasts. Breast stimulation can make your breasts produce more milk. °· Spending as much time as possible with your newborn is very important. During this time, you and your newborn can feel close and get to know each other. Having your newborn stay in your room (rooming in) will help to strengthen the bond with your newborn.  It will give you time to get to know your newborn and become comfortable caring for your newborn. °· Your hormones change after delivery. Sometimes the hormone changes can temporarily cause you to feel sad or tearful. These feelings should not last more than a few days. If these feelings last longer than that, you should talk to your caregiver. °· If desired, talk to your caregiver about methods of family planning or contraception. °·   Talk to your caregiver about immunizations. Your caregiver may want you to have the following immunizations before leaving the hospital:  Tetanus, diphtheria, and pertussis (Tdap) or tetanus and diphtheria (Td) immunization. It is very important that you and your family (including grandparents) or others caring for your newborn are up-to-date with the Tdap or Td immunizations. The Tdap or Td immunization can help protect your newborn from getting ill.  Rubella immunization.  Varicella (chickenpox) immunization.  Influenza immunization. You should  receive this annual immunization if you did not receive the immunization during your pregnancy.   This information is not intended to replace advice given to you by your health care provider. Make sure you discuss any questions you have with your health care provider.   Document Released: 05/15/2007 Document Revised: 04/11/2012 Document Reviewed: 03/14/2012 Elsevier Interactive Patient Education 2016 [employer]   Storing Breast Milk Breast milk is a living fluid that contains infection-fighting cells (antibodies). Pre-pumped (expressed) breast milk needs to be stored in a certain way so that it remains effective in protecting your baby against infections. The following guidelines are for storing breast milk for a healthy, full-term infant.  HOW LONG CAN BREAST MILK BE STORED?  Milk can be stored for up to 4 hours at room temperature, or 60-35F (15.6-19.4C). However, it is acceptable to allow the milk to sit for 6-8 hours if the pump parts and containers are well cleaned.  Milk can be stored for 3 days in a refrigerator at less than 20F (3.9C). However, it is acceptable to allow the milk to sit for up to 8 days if the pump parts and containers are well cleaned.  Milk can be stored for 2 weeks in a freezer compartment inside a refrigerator.  Milk can be stored for 3-4 months in a freezer unit with a separate door.  Milk can be stored for 6-12 months in a deep freezer at -50F (-20C). A deep freezer is a chest or stand-alone freezer that is not opened very often and stays at a colder temperature. HOW SHOULD I STORE BREAST MILK?  Milk may be stored in a:  Glass container.  Hard plastic container.  Plastic bag specially designed for storing milk. Many women like these because they take up less space and can be attached directly to the breast pump.  Store your milk in 2-4 oz (60-120 mL) servings. This makes it easier to thaw the milk. It also helps you avoid having to throw out milk  your baby does not drink.  Leave an inch or so at the top of the bag or bottle so the milk has room to expand as it freezes.  Label each container with the date and time the milk was pumped so that you use the milk in the order it was pumped.  If you will be freezing the milk, store it in the back of the freezer. This prevents the milk from being affected by temperature changes due to the freezer door being opened. THAWING FROZEN BREAST MILK  Frozen milk can be thawed:  In a refrigerator.  Under warm-running tap water.  In a pan of warm water that has been heated on the stove.  Do not heat milk directly on the stove or in a microwave as this will destroy some of its infection-fighting properties.  Thawed milk can be stored in the refrigerator for up to 24 hours but should not be refrozen.  If you want to add freshly pumped milk to the frozen  milk, make sure to add less milk than what is already frozen. Chill the fresh milk in your refrigerator for 30 minutes before adding it to the milk in the freezer.   This information is not intended to replace advice given to you by your health care provider. Make sure you discuss any questions you have with your health care provider.   Document Released: 05/15/2009 Document Revised: 07/23/2013 Document Reviewed: 04/29/2013 Elsevier Interactive Patient Education [employer]

## 2016-03-12 NOTE — Lactation Note (Signed)
This note was copied from a baby's chart. Lactation Consultation Note  Patient Name: Boy Shari ProwsLyhoung Ober ZOXWR'UToday's Date: 03/12/2016 Reason for consult: Follow-up assessment Follow up visit made.  Mom is currently feeding baby.  Latch is good and baby is actively nursing.  Mom's breasts are filling.  Breast massage encouraged during feeding.  Outpatient lactation services and support reviewed and encouraged prn.  Maternal Data    Feeding Feeding Type: Breast Fed Length of feed: 15 min  LATCH Score/Interventions Latch: Grasps breast easily, tongue down, lips flanged, rhythmical sucking.  Audible Swallowing: Spontaneous and intermittent Intervention(s): Alternate breast massage  Type of Nipple: Everted at rest and after stimulation  Comfort (Breast/Nipple): Soft / non-tender     Hold (Positioning): No assistance needed to correctly position infant at breast. Intervention(s): Breastfeeding basics reviewed  LATCH Score: 10  Lactation Tools Discussed/Used     Consult Status Consult Status: Complete    Huston FoleyMOULDEN, Vana Arif S 03/12/2016, 12:21 PM

## 2016-03-16 ENCOUNTER — Encounter: Payer: Medicaid Other | Admitting: Medical

## 2016-03-16 ENCOUNTER — Other Ambulatory Visit: Payer: Medicaid Other | Admitting: Medical

## 2016-04-26 ENCOUNTER — Ambulatory Visit (INDEPENDENT_AMBULATORY_CARE_PROVIDER_SITE_OTHER): Payer: Medicaid Other | Admitting: Advanced Practice Midwife

## 2016-04-26 DIAGNOSIS — Z309 Encounter for contraceptive management, unspecified: Secondary | ICD-10-CM

## 2016-04-26 DIAGNOSIS — Z3009 Encounter for other general counseling and advice on contraception: Secondary | ICD-10-CM

## 2016-04-26 NOTE — Progress Notes (Signed)
Subjective:     Shannon Davila is a 25 y.o. female who presents for a postpartum visit. She is 6 weeks postpartum following a spontaneous vaginal delivery. I have fully reviewed the prenatal and intrapartum course. The delivery was at 39 gestational weeks. Outcome: spontaneous vaginal delivery. Anesthesia: epidural. Postpartum course has been uncomplicated. Baby's course has been uncomplicated. Baby is feeding by breast. Bleeding no bleeding. Bowel function is normal. Bladder function is normal. Patient is sexually active. Had sexual intercourse a few days ago w/out Contraception. Requesting Nexplanon. Postpartum depression screening: negative.  The following portions of the patient's history were reviewed and updated as appropriate: allergies, current medications, past family history, past medical history, past social history, past surgical history and problem list.  Review of Systems Pertinent items are noted in HPI.   Objective:    BP 107/71 (BP Location: Left Arm, Patient Position: Sitting, Cuff Size: Normal)   Pulse 63   General:  alert, cooperative, appears stated age and no distress   Breasts:  Declined  Lungs: clear to auscultation bilaterally  Heart:  regular rate and rhythm, S1, S2 normal, no murmur, click, rub or gallop  Abdomen: soft, non-tender; bowel sounds normal; no masses,  no organomegaly   Vulva:  Declined exam.        UPT negative  Assessment:     Normal postpartum exam. Pap smear not done at today's visit.      Plan:    1. Contraception: Requesting Nexplanon, but had recent unprotected sex. Cannot ensure the patient is not pregnant today. 2. Abstain or use condoms for 2 weeks and return for pregnancy test and Nexplanon 3. Nexplanon Teaching done.

## 2016-04-26 NOTE — Patient Instructions (Signed)
Etonogestrel implant What is this medicine? ETONOGESTREL (et oh noe JES trel) is a contraceptive (birth control) device. It is used to prevent pregnancy. It can be used for up to 3 years. This medicine may be used for other purposes; ask your health care provider or pharmacist if you have questions. What should I tell my health care provider before I take this medicine? They need to know if you have any of these conditions: -abnormal vaginal bleeding -blood vessel disease or blood clots -cancer of the breast, cervix, or liver -depression -diabetes -gallbladder disease -headaches -heart disease or recent heart attack -high blood pressure -high cholesterol -kidney disease -liver disease -renal disease -seizures -tobacco smoker -an unusual or allergic reaction to etonogestrel, other hormones, anesthetics or antiseptics, medicines, foods, dyes, or preservatives -pregnant or trying to get pregnant -breast-feeding How should I use this medicine? This device is inserted just under the skin on the inner side of your upper arm by a health care professional. Talk to your pediatrician regarding the use of this medicine in children. Special care may be needed. Overdosage: If you think you have taken too much of this medicine contact a poison control center or emergency room at once. NOTE: This medicine is only for you. Do not share this medicine with others. What if I miss a dose? This does not apply. What may interact with this medicine? Do not take this medicine with any of the following medications: -amprenavir -bosentan -fosamprenavir This medicine may also interact with the following medications: -barbiturate medicines for inducing sleep or treating seizures -certain medicines for fungal infections like ketoconazole and itraconazole -griseofulvin -medicines to treat seizures like carbamazepine, felbamate, oxcarbazepine, phenytoin,  topiramate -modafinil -phenylbutazone -rifampin -some medicines to treat HIV infection like atazanavir, indinavir, lopinavir, nelfinavir, tipranavir, ritonavir -St. John's wort This list may not describe all possible interactions. Give your health care provider a list of all the medicines, herbs, non-prescription drugs, or dietary supplements you use. Also tell them if you smoke, drink alcohol, or use illegal drugs. Some items may interact with your medicine. What should I watch for while using this medicine? This product does not protect you against HIV infection (AIDS) or other sexually transmitted diseases. You should be able to feel the implant by pressing your fingertips over the skin where it was inserted. Contact your doctor if you cannot feel the implant, and use a non-hormonal birth control method (such as condoms) until your doctor confirms that the implant is in place. If you feel that the implant may have broken or become bent while in your arm, contact your healthcare provider. What side effects may I notice from receiving this medicine? Side effects that you should report to your doctor or health care professional as soon as possible: -allergic reactions like skin rash, itching or hives, swelling of the face, lips, or tongue -breast lumps -changes in emotions or moods -depressed mood -heavy or prolonged menstrual bleeding -pain, irritation, swelling, or bruising at the insertion site -scar at site of insertion -signs of infection at the insertion site such as fever, and skin redness, pain or discharge -signs of pregnancy -signs and symptoms of a blood clot such as breathing problems; changes in vision; chest pain; severe, sudden headache; pain, swelling, warmth in the leg; trouble speaking; sudden numbness or weakness of the face, arm or leg -signs and symptoms of liver injury like dark yellow or brown urine; general ill feeling or flu-like symptoms; light-colored stools; loss of  appetite; nausea; right upper belly   pain; unusually weak or tired; yellowing of the eyes or skin -unusual vaginal bleeding, discharge -signs and symptoms of a stroke like changes in vision; confusion; trouble speaking or understanding; severe headaches; sudden numbness or weakness of the face, arm or leg; trouble walking; dizziness; loss of balance or coordination Side effects that usually do not require medical attention (Report these to your doctor or health care professional if they continue or are bothersome.): -acne -back pain -breast pain -changes in weight -dizziness -general ill feeling or flu-like symptoms -headache -irregular menstrual bleeding -nausea -sore throat -vaginal irritation or inflammation This list may not describe all possible side effects. Call your doctor for medical advice about side effects. You may report side effects to FDA at 1-800-FDA-1088. Where should I keep my medicine? This drug is given in a hospital or clinic and will not be stored at home. NOTE: This sheet is a summary. It may not cover all possible information. If you have questions about this medicine, talk to your doctor, pharmacist, or health care provider.    2016, Elsevier/Gold Standard. (2014-05-02 14:07:06)  

## 2016-05-17 ENCOUNTER — Encounter: Payer: Self-pay | Admitting: Advanced Practice Midwife

## 2016-05-17 ENCOUNTER — Ambulatory Visit (INDEPENDENT_AMBULATORY_CARE_PROVIDER_SITE_OTHER): Payer: Medicaid Other | Admitting: Advanced Practice Midwife

## 2016-05-17 VITALS — BP 105/70 | HR 59 | Wt 109.7 lb

## 2016-05-17 DIAGNOSIS — Z30017 Encounter for initial prescription of implantable subdermal contraceptive: Secondary | ICD-10-CM | POA: Diagnosis present

## 2016-05-17 LAB — POCT PREGNANCY, URINE: Preg Test, Ur: NEGATIVE

## 2016-05-17 MED ORDER — ETONOGESTREL 68 MG ~~LOC~~ IMPL
68.0000 mg | DRUG_IMPLANT | Freq: Once | SUBCUTANEOUS | Status: AC
  Administered 2016-05-17: 68 mg via SUBCUTANEOUS

## 2016-05-17 MED ORDER — IBUPROFEN 600 MG PO TABS: 600.0000 mg | ORAL_TABLET | Freq: Four times a day (QID) | ORAL | 1 refills | Status: AC

## 2016-05-17 NOTE — Patient Instructions (Signed)
Etonogestrel implant What is this medicine? ETONOGESTREL (et oh noe JES trel) is a contraceptive (birth control) device. It is used to prevent pregnancy. It can be used for up to 3 years. This medicine may be used for other purposes; ask your health care provider or pharmacist if you have questions. What should I tell my health care provider before I take this medicine? They need to know if you have any of these conditions: -abnormal vaginal bleeding -blood vessel disease or blood clots -cancer of the breast, cervix, or liver -depression -diabetes -gallbladder disease -headaches -heart disease or recent heart attack -high blood pressure -high cholesterol -kidney disease -liver disease -renal disease -seizures -tobacco smoker -an unusual or allergic reaction to etonogestrel, other hormones, anesthetics or antiseptics, medicines, foods, dyes, or preservatives -pregnant or trying to get pregnant -breast-feeding How should I use this medicine? This device is inserted just under the skin on the inner side of your upper arm by a health care professional. Talk to your pediatrician regarding the use of this medicine in children. Special care may be needed. Overdosage: If you think you have taken too much of this medicine contact a poison control center or emergency room at once. NOTE: This medicine is only for you. Do not share this medicine with others. What if I miss a dose? This does not apply. What may interact with this medicine? Do not take this medicine with any of the following medications: -amprenavir -bosentan -fosamprenavir This medicine may also interact with the following medications: -barbiturate medicines for inducing sleep or treating seizures -certain medicines for fungal infections like ketoconazole and itraconazole -griseofulvin -medicines to treat seizures like carbamazepine, felbamate, oxcarbazepine, phenytoin,  topiramate -modafinil -phenylbutazone -rifampin -some medicines to treat HIV infection like atazanavir, indinavir, lopinavir, nelfinavir, tipranavir, ritonavir -St. John's wort This list may not describe all possible interactions. Give your health care provider a list of all the medicines, herbs, non-prescription drugs, or dietary supplements you use. Also tell them if you smoke, drink alcohol, or use illegal drugs. Some items may interact with your medicine. What should I watch for while using this medicine? This product does not protect you against HIV infection (AIDS) or other sexually transmitted diseases. You should be able to feel the implant by pressing your fingertips over the skin where it was inserted. Contact your doctor if you cannot feel the implant, and use a non-hormonal birth control method (such as condoms) until your doctor confirms that the implant is in place. If you feel that the implant may have broken or become bent while in your arm, contact your healthcare provider. What side effects may I notice from receiving this medicine? Side effects that you should report to your doctor or health care professional as soon as possible: -allergic reactions like skin rash, itching or hives, swelling of the face, lips, or tongue -breast lumps -changes in emotions or moods -depressed mood -heavy or prolonged menstrual bleeding -pain, irritation, swelling, or bruising at the insertion site -scar at site of insertion -signs of infection at the insertion site such as fever, and skin redness, pain or discharge -signs of pregnancy -signs and symptoms of a blood clot such as breathing problems; changes in vision; chest pain; severe, sudden headache; pain, swelling, warmth in the leg; trouble speaking; sudden numbness or weakness of the face, arm or leg -signs and symptoms of liver injury like dark yellow or brown urine; general ill feeling or flu-like symptoms; light-colored stools; loss of  appetite; nausea; right upper belly   pain; unusually weak or tired; yellowing of the eyes or skin -unusual vaginal bleeding, discharge -signs and symptoms of a stroke like changes in vision; confusion; trouble speaking or understanding; severe headaches; sudden numbness or weakness of the face, arm or leg; trouble walking; dizziness; loss of balance or coordination Side effects that usually do not require medical attention (Report these to your doctor or health care professional if they continue or are bothersome.): -acne -back pain -breast pain -changes in weight -dizziness -general ill feeling or flu-like symptoms -headache -irregular menstrual bleeding -nausea -sore throat -vaginal irritation or inflammation This list may not describe all possible side effects. Call your doctor for medical advice about side effects. You may report side effects to FDA at 1-800-FDA-1088. Where should I keep my medicine? This drug is given in a hospital or clinic and will not be stored at home. NOTE: This sheet is a summary. It may not cover all possible information. If you have questions about this medicine, talk to your doctor, pharmacist, or health care provider.    2016, Elsevier/Gold Standard. (2014-05-02 14:07:06)  

## 2016-05-17 NOTE — Progress Notes (Signed)
Language Resources Interpreter ClintonSukkun Yung

## 2016-05-19 NOTE — Progress Notes (Signed)
Patient given informed consent, signed copy in the chart, time out was performed. Pregnancy test was Negative. Appropriate time out taken.  Patient's left arm was prepped and draped in the usual sterile fashion.. The ruler used to measure and mark insertion area.  Pt was prepped with alcohol swab and then injected with 1.5 cc of 2% lidocaine with epinephrine.  Pt was prepped with betadine, Implanon removed form packaging,  Device confirmed in needle, then inserted full length of needle and withdrawn per handbook instructions.  Pt insertion site covered with pressure dressing.   Minimal blood loss.  Pt tolerated the procedure well.

## 2017-01-25 IMAGING — DX DG CHEST 2V
2 series · 2 of 2 positions shown · non-contrast
Comparison: None.

CLINICAL DATA: Productive cough.

EXAM:
CHEST  2 VIEW

[w chest pa]
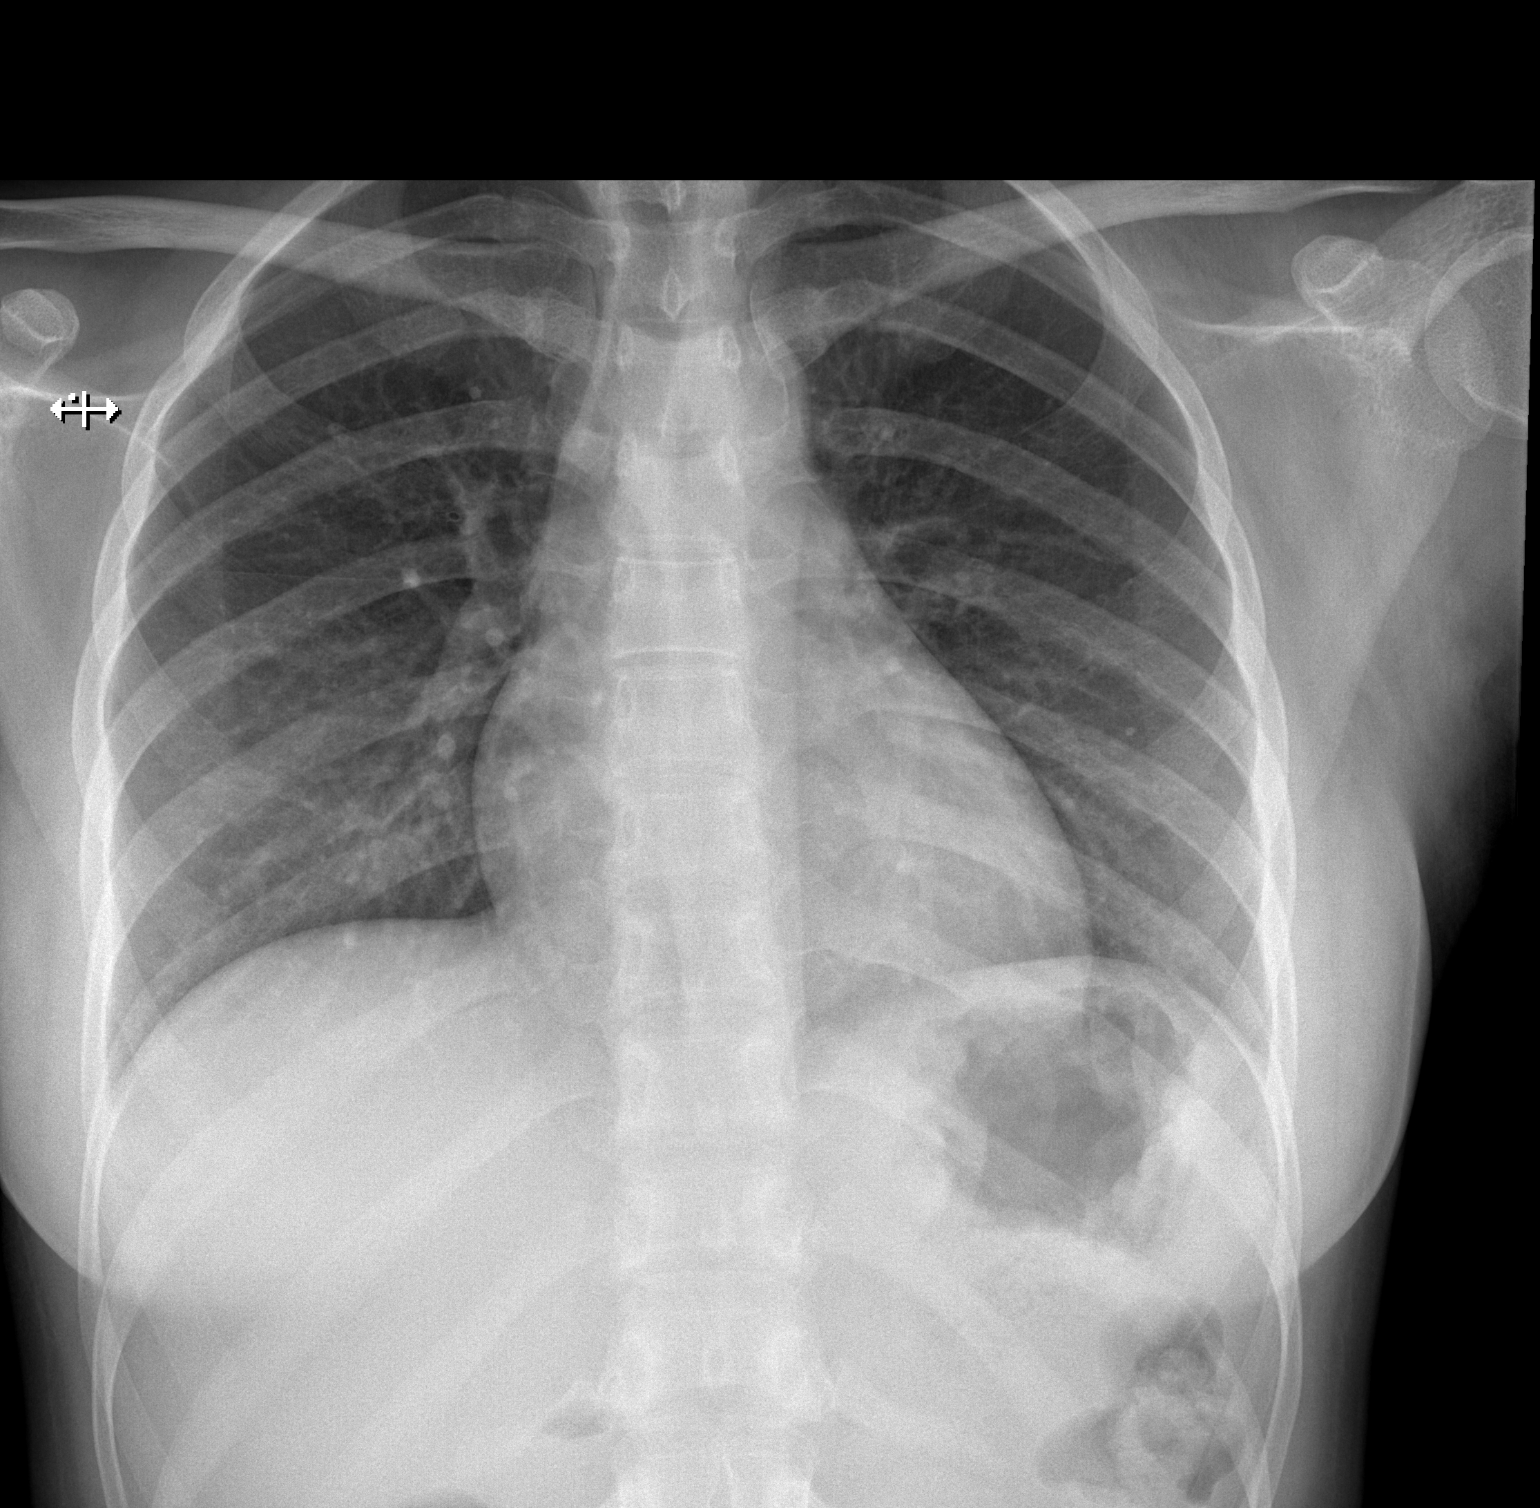

[w chest lat]
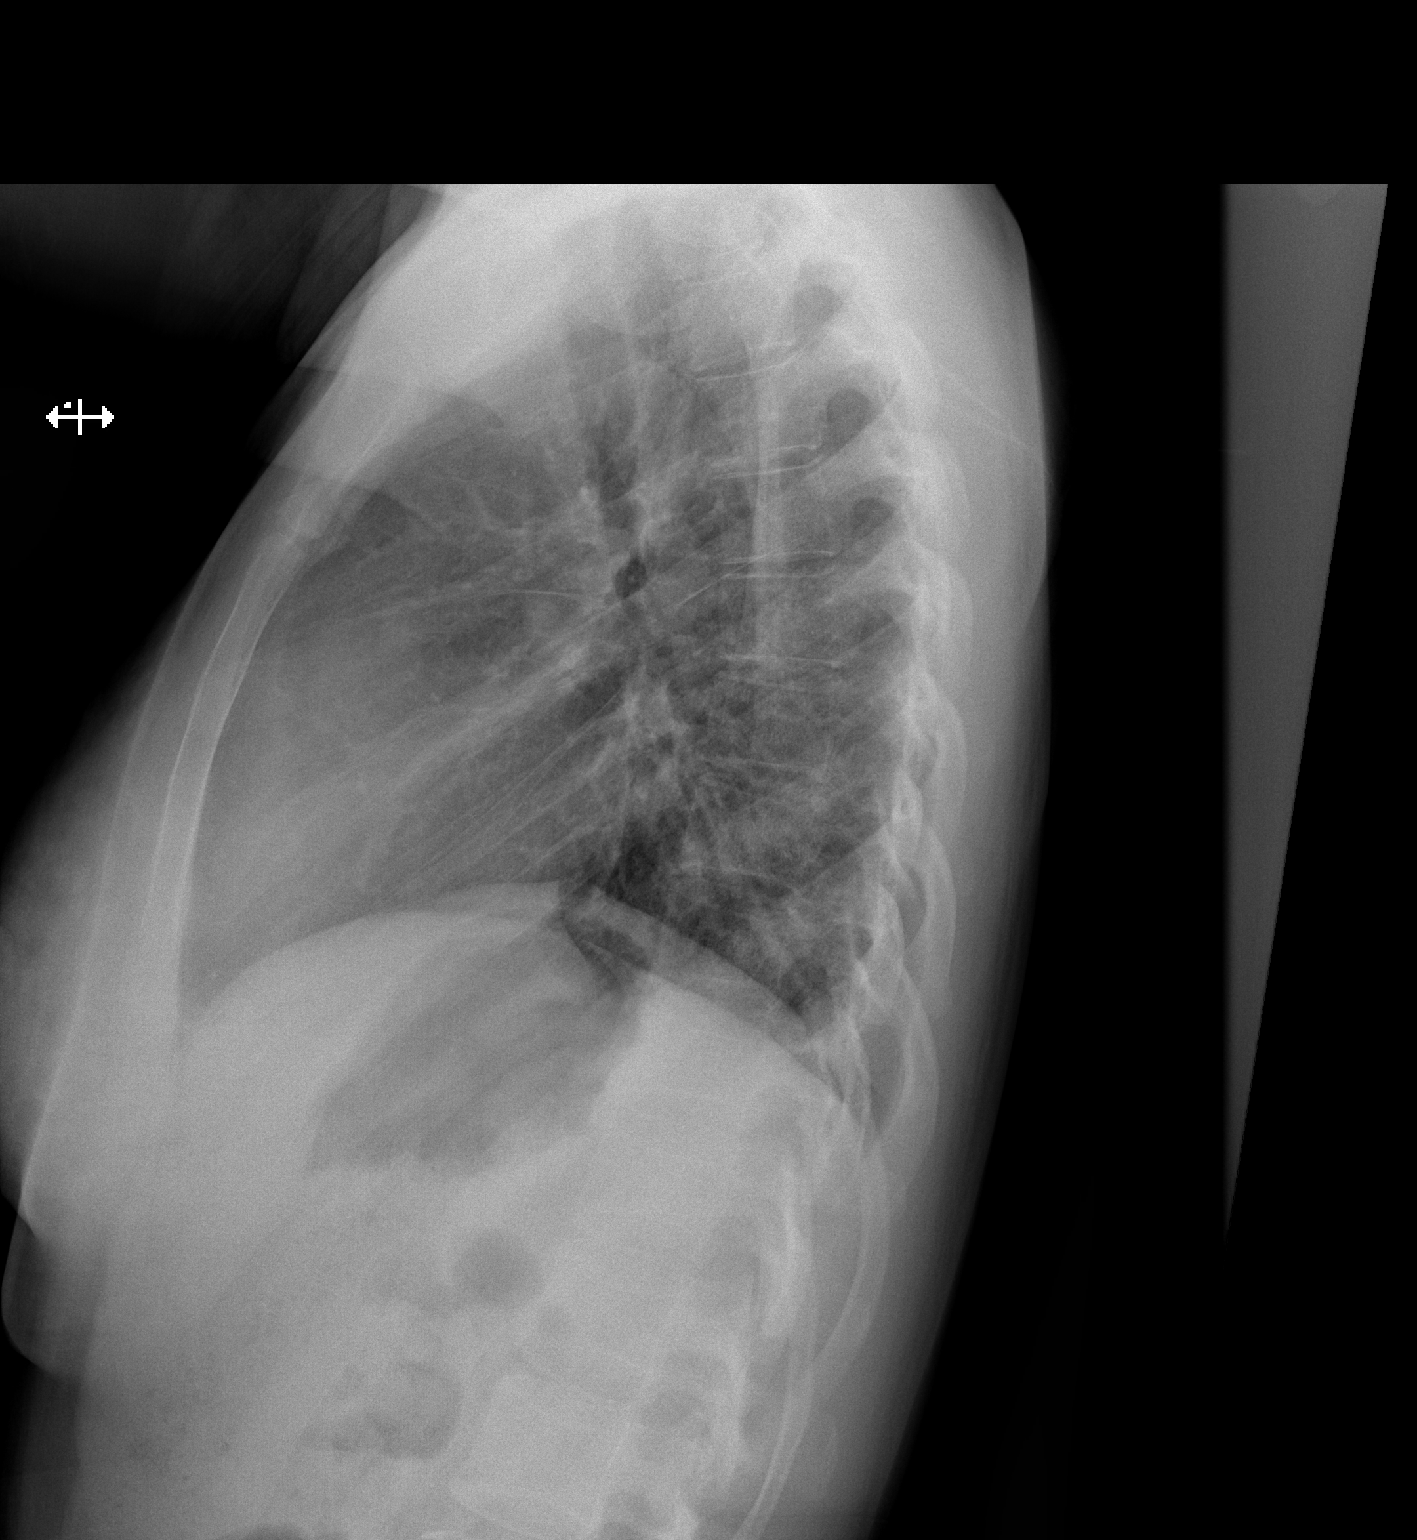

[2 of 2 positions shown; findings below may reference images not displayed]

FINDINGS: Cardiomediastinal silhouette is normal in size and configuration.
Lungs are clear. Lung volumes are normal. No evidence of pneumonia.
No pleural effusion. No pneumothorax.

Osseous and soft tissue structures about the chest are unremarkable.
IMPRESSION: Lungs are clear and there is no evidence of acute cardiopulmonary
abnormality. No evidence of pneumonia.

## 2017-02-25 IMAGING — US US MFM OB COMP +14 WKS
1 series · 14 of 28 positions shown · non-contrast
Comparison: none

[Series 1: us mfm ob comp +14 wks · 108 acquisitions, 14 frames shown]
[im 4/108]
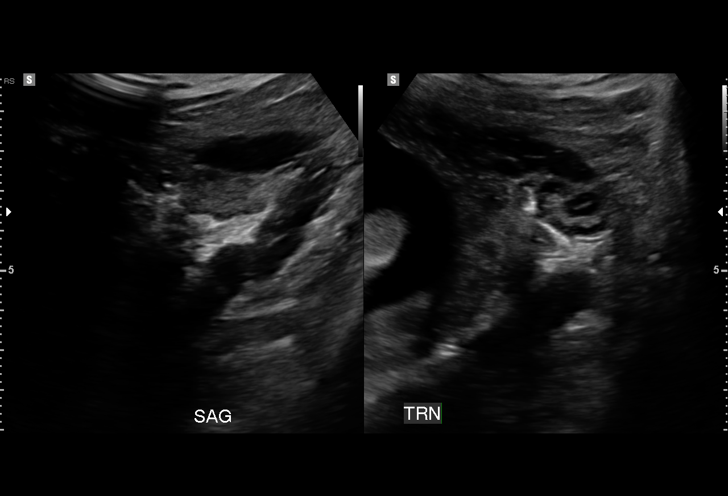
[im 12/108]
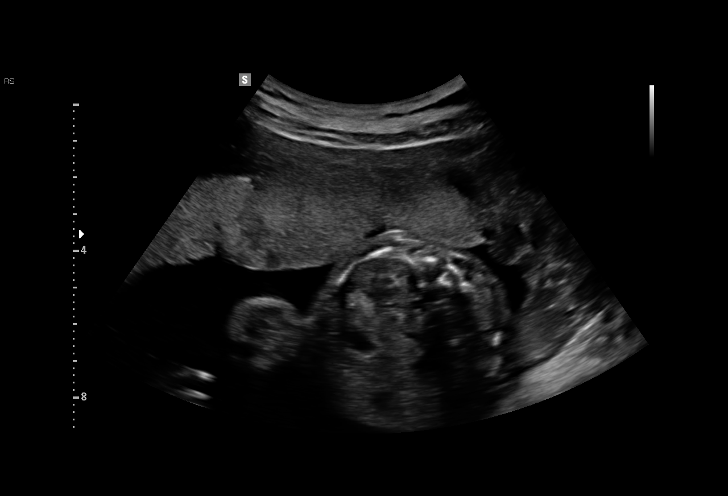
[im 20/108]
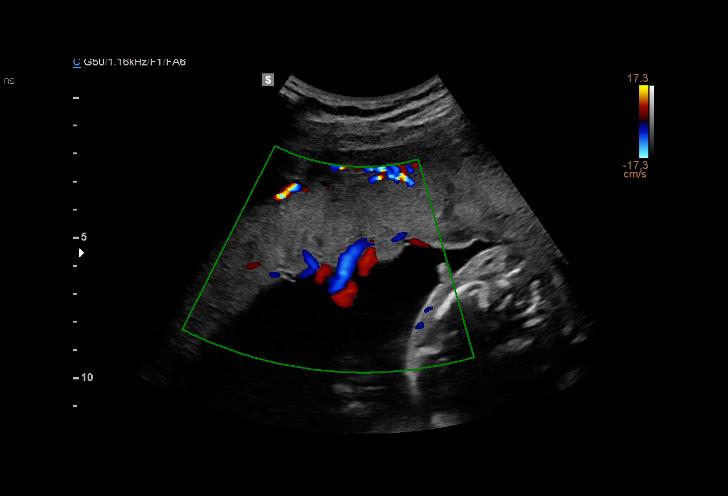
[im 28/108]
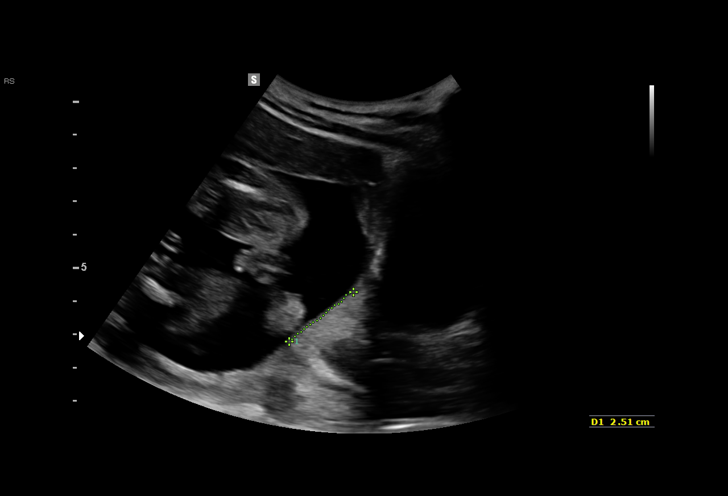
[im 36/108]
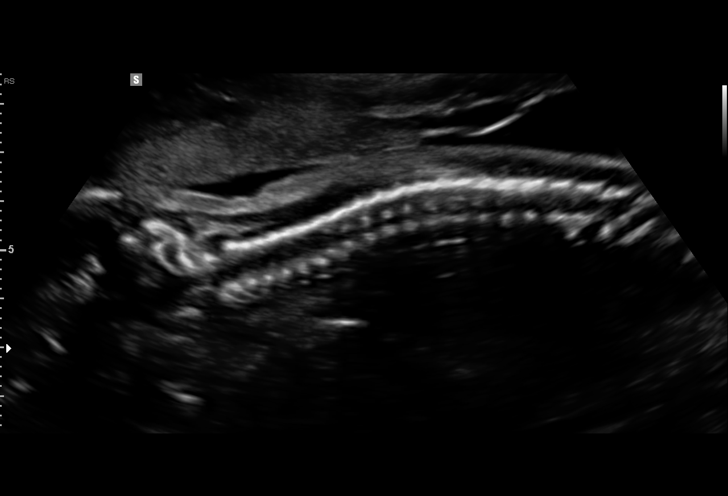
[im 44/108]
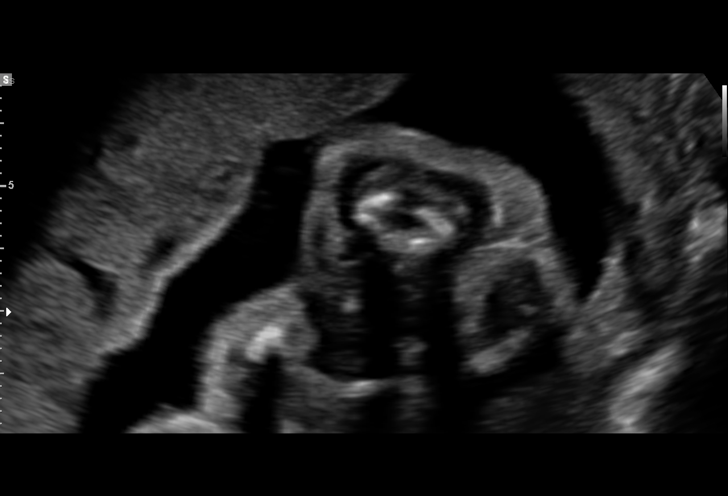
[im 52/108]
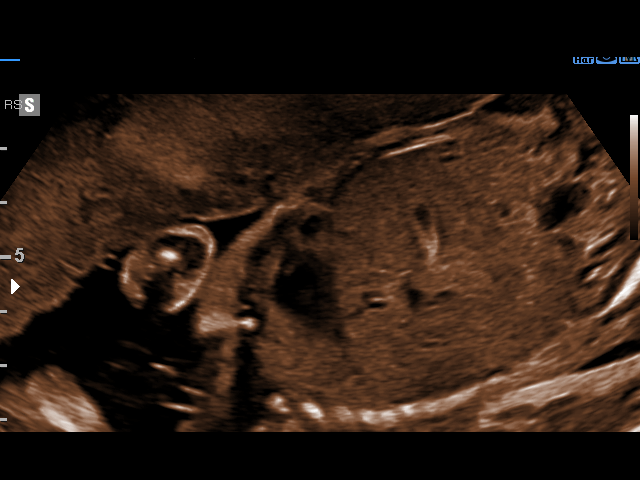
[im 60/108]
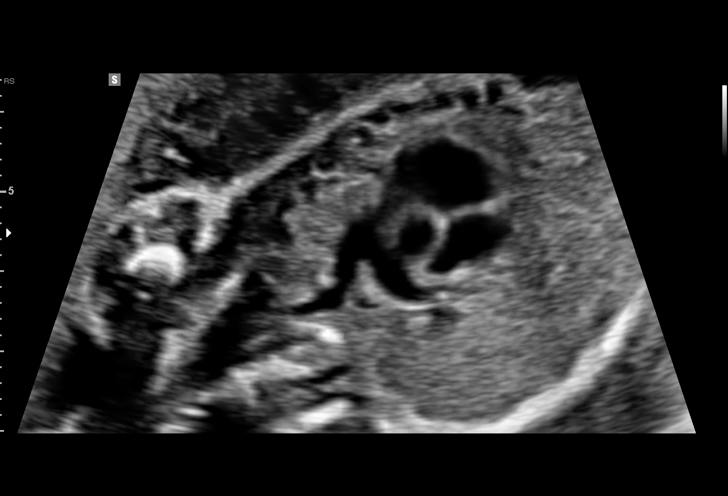
[im 68/108]
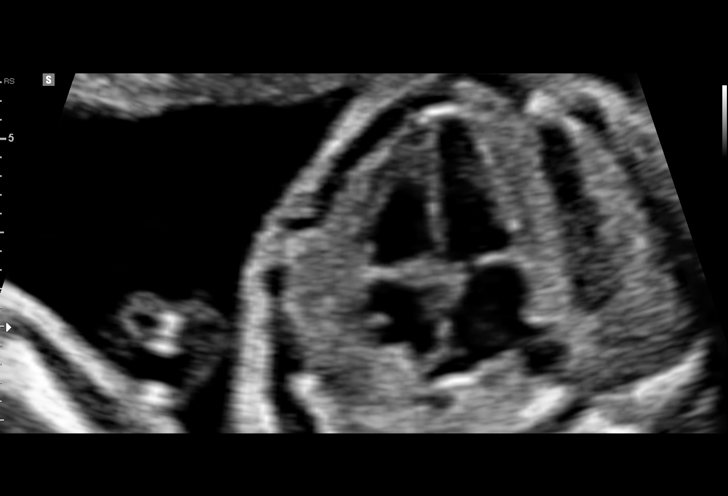
[im 76/108]
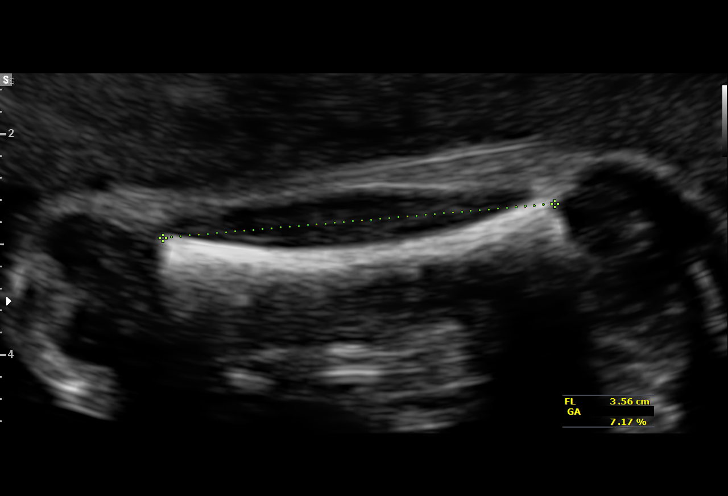
[im 84/108]
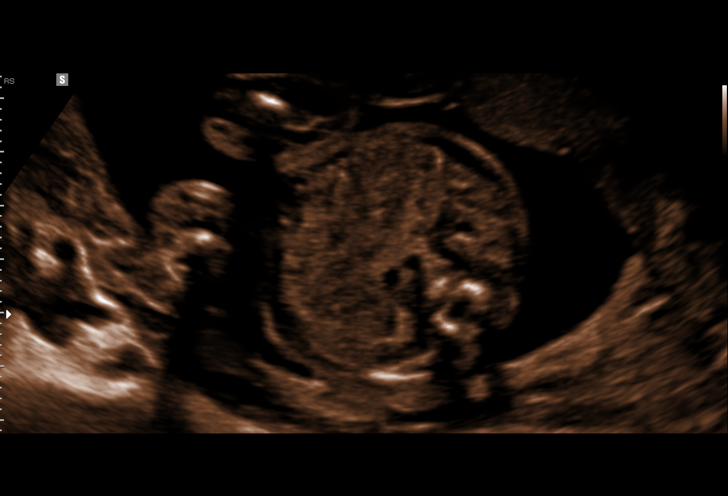
[im 92/108]
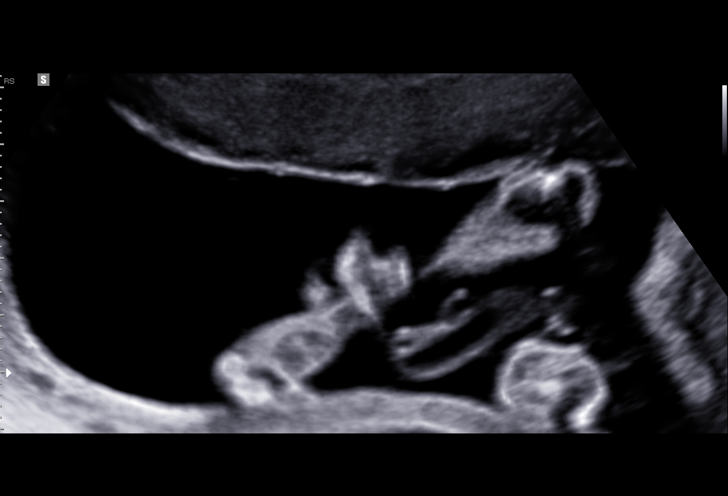
[im 100/108]
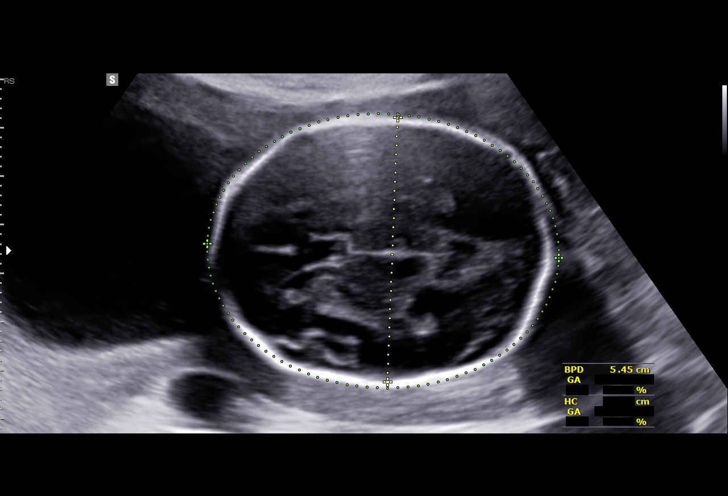
[im 108/108]
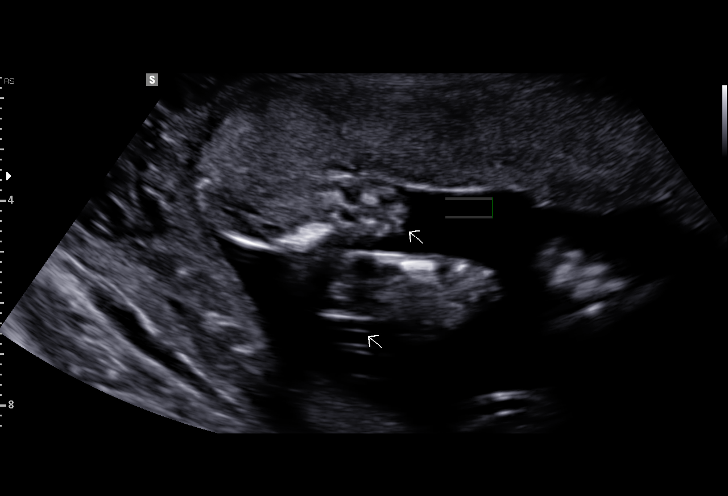

[14 of 28 positions shown; findings below may reference images not displayed]

[facility] Clinic
[REDACTED]-
Faculty Physician

1  [facility]            [id number]      [id number]     [id number]
Indications

22 weeks gestation of pregnancy
Basic anatomic survey                          Z36
Late prenatal care, second trimester
OB History

Gravidity:    1
Fetal Evaluation

Num Of Fetuses:     1
Fetal Heart         162
Rate(bpm):
Cardiac Activity:   Observed
Presentation:       Breech
Placenta:           Anterior, above cervical os
P. Cord Insertion:  Visualized, central

Amniotic Fluid
AFI FV:      Subjectively within normal limits
Larg Pckt:   4.37   cm
Biometry

BPD:      54.3  mm     G. Age:  22w 4d                  CI:        71.49   %   70 - 86
FL/HC:      17.4   %   19.2 -
HC:      204.5  mm     G. Age:  22w 4d         37  %    HC/AC:      1.16       1.05 -
AC:       177   mm     G. Age:  22w 4d         43  %    FL/BPD:     65.4   %   71 - 87
FL:       35.5  mm     G. Age:  21w 1d          8  %    FL/AC:      20.1   %   20 - 24
HUM:      35.3  mm     G. Age:  22w 1d         36  %
CER:        26  mm     G. Age:  24w 0d         71  %
NFT:       4.8  mm
CM:        3.3  mm

Est. FW:     473  gm      1 lb 1 oz     37  %
Gestational Age

LMP:           22w 4d       Date:   06/07/15                 EDD:   03/13/16
U/S Today:     22w 2d                                        EDD:   03/15/16
Best:          22w 4d    Det. By:   LMP  (06/07/15)          EDD:   03/13/16
Anatomy

Cranium:          Appears normal         Aortic Arch:      Appears normal
Fetal Cavum:      Appears normal         Ductal Arch:      Appears normal
Ventricles:       Appears normal         Diaphragm:        Appears normal
Choroid Plexus:   Appears normal         Stomach:          Appears normal, left
sided
Cerebellum:       Appears normal         Abdomen:          Appears normal
Posterior Fossa:  Appears normal         Abdominal Wall:   Appears nml (cord
insert, abd wall)
Nuchal Fold:      Not applicable (>20    Cord Vessels:     Appears normal (3
wks GA)                                  vessel cord)
Face:             Orbits nl; profile not Kidneys:          Appear normal
well visualized
Lips:             Appears normal         Bladder:          Appears normal
Fetal Thoracic:   Appears normal         Spine:            Appears normal
Heart:            Appears normal         Upper             Appears normal
(4CH, axis, and        Extremities:
situs)
RVOT:             Appears normal         Lower             Appears normal
Extremities:
LVOT:             Appears normal

Other:  Fetus appears to be a male. Heels visualized.
Cervix Uterus Adnexa

Cervix
Length:           3.35  cm.
Normal appearance by transabdominal scan.

Uterus
No abnormality visualized.

Left Ovary
Within normal limits.

Right Ovary
Within normal limits.

Adnexa:       No abnormality visualized. No adnexal mass
visualized.
Impression

SIUP at 22+4 weeks
Normal detailed fetal anatomy; limited views of profile
Normal amniotic fluid volume
Measurements consistent with LMP dating
Recommendations

Follow-up as clinically indicated

## 2018-05-28 ENCOUNTER — Ambulatory Visit: Payer: Self-pay | Admitting: Family Medicine

## 2021-08-04 ENCOUNTER — Ambulatory Visit: Payer: Self-pay | Admitting: *Deleted

## 2021-08-04 NOTE — Telephone Encounter (Signed)
Pt calling in wanting to know where to get her birth control taken out at since she got it at the hospital in 2017    Attempted to reach pt, VM not set up. Assisted by Vivi Barrack  [phone]

## 2022-06-22 ENCOUNTER — Ambulatory Visit (INDEPENDENT_AMBULATORY_CARE_PROVIDER_SITE_OTHER): Payer: 59 | Admitting: Obstetrics

## 2022-06-22 ENCOUNTER — Encounter: Payer: Self-pay | Admitting: Obstetrics

## 2022-06-22 VITALS — BP 124/82 | HR 73 | Wt 114.3 lb

## 2022-06-22 DIAGNOSIS — Z3046 Encounter for surveillance of implantable subdermal contraceptive: Secondary | ICD-10-CM | POA: Diagnosis not present

## 2022-06-22 DIAGNOSIS — L089 Local infection of the skin and subcutaneous tissue, unspecified: Secondary | ICD-10-CM

## 2022-06-22 MED ORDER — AMOXICILLIN-POT CLAVULANATE 500-125 MG PO TABS: 1.0000 | ORAL_TABLET | Freq: Two times a day (BID) | ORAL | 0 refills | Status: AC

## 2022-06-22 NOTE — Progress Notes (Signed)
NEXPLANON REMOVAL NOTE  Date of LMP:   unknown  Contraception used: *Nexplanon   Indications:  The patient desires removal of Nexplanon .  She understands risks, benefits, and alternatives to Implanon and would like to proceed.  Anesthesia:   Lidocaine 1% plain.  Procedure:  A time-out was performed confirming the procedure and the patient's allergy status.  Complications: None                      The rod was palpated and the area was sterilely prepped.  The area beneath the distal tip was anesthetized with 1% xylocaine and the skin incised                       Over the tip and the tip was exposed, grasped with forcep and removed intact.  A single suture of 4-0 Vicryl was used to close incision.  Steri strip                       And a bandage applied and the arm was wrapped with gauze bandage.  The patient tolerated well.  Instructions:  The patient was instructed to remove the dressing in 24 hours and that some bruising is to be expected.  She was advised to use over the counter analgesics as needed for any pain at the site.  She is to keep the area dry for 24 hours and to call if her hand or arm becomes cold, numb, or blue.  Return visit:  Return in 2 weeks  Brock Bad, MD 06/22/2022 8:56 AM

## 2022-06-22 NOTE — Progress Notes (Signed)
NGYN presents for Nexplanon removal. Pt states Nexplanon placed over 5 years ago.   Last PAP 2017. Pt declines PAP today.

## 2022-07-05 ENCOUNTER — Ambulatory Visit (INDEPENDENT_AMBULATORY_CARE_PROVIDER_SITE_OTHER): Payer: 59 | Admitting: Obstetrics

## 2022-07-05 DIAGNOSIS — Z30011 Encounter for initial prescription of contraceptive pills: Secondary | ICD-10-CM | POA: Diagnosis not present

## 2022-07-05 DIAGNOSIS — Z3009 Encounter for other general counseling and advice on contraception: Secondary | ICD-10-CM

## 2022-07-05 MED ORDER — NORETHIN ACE-ETH ESTRAD-FE 1-20 MG-MCG(24) PO TABS: 1.0000 | ORAL_TABLET | Freq: Every day | ORAL | 11 refills | Status: AC

## 2022-07-05 NOTE — Progress Notes (Addendum)
Subjective:    Shannon Davila is a 31 y.o. female who presents for 2 week check up after Nexplanon Removal, and for contraception counseling. The patient has no complaints today. The patient is sexually active. Pertinent past medical history: none.  The information documented in the HPI was reviewed and verified.  Menstrual History: OB History     Gravida  1   Para  1   Term  1   Preterm      AB      Living  1      SAB      IAB      Ectopic      Multiple  0   Live Births  1            No LMP recorded.   Patient Active Problem List   Diagnosis Date Noted   Language barrier, cultural differences 11/04/2015   No past medical history on file.  No past surgical history on file.   Current Outpatient Medications:    Norethindrone Acetate-Ethinyl Estrad-FE (LOESTRIN 24 FE) 1-20 MG-MCG(24) tablet, Take 1 tablet by mouth daily., Disp: 28 tablet, Rfl: 11   amoxicillin-clavulanate (AUGMENTIN) 500-125 MG tablet, Take 1 tablet by mouth 2 (two) times daily. 1 tablet po BID for 7 days., Disp: 14 tablet, Rfl: 0   docusate sodium (COLACE) 100 MG capsule, Take 1 capsule (100 mg total) by mouth 2 (two) times daily. (Patient not taking: Reported on 05/17/2016), Disp: 10 capsule, Rfl: 0   ibuprofen (ADVIL,MOTRIN) 600 MG tablet, Take 1 tablet (600 mg total) by mouth every 6 (six) hours. (Patient not taking: Reported on 06/22/2022), Disp: 30 tablet, Rfl: 1   Prenatal Vit-Fe Fumarate-FA (MULTIVITAMIN-PRENATAL) 27-0.8 MG TABS tablet, Take 1 tablet by mouth daily at 12 noon. (Patient not taking: Reported on 06/22/2022), Disp: 30 each, Rfl: 10 No Known Allergies  Social History   Tobacco Use   Smoking status: Never   Smokeless tobacco: Never  Substance Use Topics   Alcohol use: No    Family History  Problem Relation Age of Onset   Hypertension Mother        Review of Systems Constitutional: negative for weight loss Genitourinary:negative for abnormal menstrual periods and  vaginal discharge   Objective:   There were no vitals taken for this visit.   General:   alert  Skin:   no rash or abnormalities  Lungs:   clear to auscultation bilaterally  Heart:   regular rate and rhythm, S1, S2 normal, no murmur, click, rub or gallop  Left Upper Extremity:  Nexplanon removal site is clean, dry and intact  Lab Review Urine pregnancy test Labs reviewed yes Radiologic studies reviewed no  I have spent a total of 20 minutes of face-to-face time, excluding clinical staff time, reviewing notes and preparing to see patient, ordering tests and/or medications, and counseling the patient.   Assessment:    31 y.o., starting OCP (estrogen/progesterone), no contraindications.   Plan:    All questions answered. Contraception: OCP (estrogen/progesterone). Discussed healthy lifestyle modifications. Follow up in 3 months. Pregnancy test, result: negative. Meds ordered this encounter  Medications   Norethindrone Acetate-Ethinyl Estrad-FE (LOESTRIN 24 FE) 1-20 MG-MCG(24) tablet    Sig: Take 1 tablet by mouth daily.    Dispense:  28 tablet    Refill:  11    Brock Bad, MD 07/05/2022 9:22 AM

## 2022-07-05 NOTE — Progress Notes (Addendum)
31 y.o GYN presents for FU to view Nexplanon removal site.
